# Patient Record
Sex: Male | Born: 1995 | Race: Black or African American | Hispanic: No | Marital: Single | Smoking: Never smoker
Health system: Southern US, Community
[De-identification: ages and names within clinical notes are randomized; demographics above are authoritative.]

## PROBLEM LIST (undated history)

## (undated) DIAGNOSIS — Z931 Gastrostomy status: Secondary | ICD-10-CM

## (undated) DIAGNOSIS — Z930 Tracheostomy status: Secondary | ICD-10-CM

## (undated) DIAGNOSIS — G808 Other cerebral palsy: Secondary | ICD-10-CM

## (undated) DIAGNOSIS — L89153 Pressure ulcer of sacral region, stage 3: Secondary | ICD-10-CM

## (undated) HISTORY — PX: TRACHEOSTOMY: SUR1362

## (undated) HISTORY — PX: GASTROJEJUNOSTOMY W/ JEJUNOSTOMY TUBE: SHX1698

---
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
## 2020-12-16 ENCOUNTER — Other Ambulatory Visit: Payer: Self-pay

## 2020-12-16 ENCOUNTER — Inpatient Hospital Stay (HOSPITAL_COMMUNITY)
Admission: EM | Admit: 2020-12-16 | Discharge: 2020-12-19 | DRG: 177 | Disposition: A | Discharge status: Home Health | Payer: Medicaid Other | Attending: Internal Medicine | Admitting: Internal Medicine

## 2020-12-16 ENCOUNTER — Emergency Department (HOSPITAL_COMMUNITY): Payer: Medicaid Other

## 2020-12-16 ENCOUNTER — Encounter (HOSPITAL_COMMUNITY): Payer: Self-pay | Admitting: Emergency Medicine

## 2020-12-16 DIAGNOSIS — J1282 Pneumonia due to coronavirus disease 2019: Secondary | ICD-10-CM | POA: Diagnosis present

## 2020-12-16 DIAGNOSIS — J9601 Acute respiratory failure with hypoxia: Secondary | ICD-10-CM

## 2020-12-16 DIAGNOSIS — L89892 Pressure ulcer of other site, stage 2: Secondary | ICD-10-CM | POA: Diagnosis present

## 2020-12-16 DIAGNOSIS — R7881 Bacteremia: Secondary | ICD-10-CM | POA: Diagnosis present

## 2020-12-16 DIAGNOSIS — G800 Spastic quadriplegic cerebral palsy: Secondary | ICD-10-CM | POA: Diagnosis present

## 2020-12-16 DIAGNOSIS — Z930 Tracheostomy status: Secondary | ICD-10-CM

## 2020-12-16 DIAGNOSIS — Z931 Gastrostomy status: Secondary | ICD-10-CM

## 2020-12-16 DIAGNOSIS — J9621 Acute and chronic respiratory failure with hypoxia: Secondary | ICD-10-CM | POA: Diagnosis present

## 2020-12-16 DIAGNOSIS — G808 Other cerebral palsy: Secondary | ICD-10-CM | POA: Diagnosis not present

## 2020-12-16 DIAGNOSIS — Z886 Allergy status to analgesic agent status: Secondary | ICD-10-CM

## 2020-12-16 DIAGNOSIS — U071 COVID-19: Secondary | ICD-10-CM | POA: Diagnosis present

## 2020-12-16 DIAGNOSIS — L89153 Pressure ulcer of sacral region, stage 3: Secondary | ICD-10-CM | POA: Diagnosis present

## 2020-12-16 DIAGNOSIS — J690 Pneumonitis due to inhalation of food and vomit: Secondary | ICD-10-CM | POA: Diagnosis present

## 2020-12-16 DIAGNOSIS — B957 Other staphylococcus as the cause of diseases classified elsewhere: Secondary | ICD-10-CM | POA: Diagnosis present

## 2020-12-16 HISTORY — DX: Other cerebral palsy: G80.8

## 2020-12-16 HISTORY — DX: Pressure ulcer of sacral region, stage 3: L89.153

## 2020-12-16 HISTORY — DX: Gastrostomy status: Z93.1

## 2020-12-16 HISTORY — DX: Tracheostomy status: Z93.0

## 2020-12-16 LAB — CBC WITH DIFFERENTIAL/PLATELET
Abs Immature Granulocytes: 0.01 10*3/uL (ref 0.00–0.07)
Basophils Absolute: 0 10*3/uL (ref 0.0–0.1)
Basophils Relative: 0 %
Eosinophils Absolute: 0.1 10*3/uL (ref 0.0–0.5)
Eosinophils Relative: 1 %
HCT: 52.7 % — ABNORMAL HIGH (ref 39.0–52.0)
Hemoglobin: 17 g/dL (ref 13.0–17.0)
Immature Granulocytes: 0 %
Lymphocytes Relative: 15 %
Lymphs Abs: 0.9 10*3/uL (ref 0.7–4.0)
MCH: 29.6 pg (ref 26.0–34.0)
MCHC: 32.3 g/dL (ref 30.0–36.0)
MCV: 91.8 fL (ref 80.0–100.0)
Monocytes Absolute: 0.8 10*3/uL (ref 0.1–1.0)
Monocytes Relative: 13 %
Neutro Abs: 4.1 10*3/uL (ref 1.7–7.7)
Neutrophils Relative %: 71 %
Platelets: 232 10*3/uL (ref 150–400)
RBC: 5.74 MIL/uL (ref 4.22–5.81)
RDW: 14.9 % (ref 11.5–15.5)
WBC: 5.8 10*3/uL (ref 4.0–10.5)
nRBC: 0 % (ref 0.0–0.2)

## 2020-12-16 LAB — FIBRINOGEN: Fibrinogen: 451 mg/dL (ref 210–475)

## 2020-12-16 LAB — PROCALCITONIN: Procalcitonin: <0.1 ng/mL

## 2020-12-16 LAB — LACTATE DEHYDROGENASE: LDH: 163 U/L (ref 98–192)

## 2020-12-16 LAB — C-REACTIVE PROTEIN: CRP: 20.9 mg/dL — ABNORMAL HIGH (ref <1.0)

## 2020-12-16 LAB — FERRITIN: Ferritin: 106 ng/mL (ref 24–336)

## 2020-12-16 LAB — HIV ANTIBODY (ROUTINE TESTING W REFLEX): HIV Screen 4th Generation wRfx: NONREACTIVE

## 2020-12-16 LAB — COMPREHENSIVE METABOLIC PANEL
ALT: 29 U/L (ref 0–44)
AST: 36 U/L (ref 15–41)
Albumin: 4.7 g/dL (ref 3.5–5.0)
Alkaline Phosphatase: 148 U/L — ABNORMAL HIGH (ref 38–126)
Anion gap: 18 — ABNORMAL HIGH (ref 5–15)
BUN: 6 mg/dL (ref 6–20)
CO2: 23 mmol/L (ref 22–32)
Calcium: 9.4 mg/dL (ref 8.9–10.3)
Chloride: 95 mmol/L — ABNORMAL LOW (ref 98–111)
Creatinine, Ser: 0.38 mg/dL — ABNORMAL LOW (ref 0.61–1.24)
GFR, Estimated: >60 mL/min (ref >60)
Glucose, Bld: 110 mg/dL — ABNORMAL HIGH (ref 70–99)
Potassium: 3.3 mmol/L — ABNORMAL LOW (ref 3.5–5.1)
Sodium: 136 mmol/L (ref 135–145)
Total Bilirubin: 0.5 mg/dL (ref 0.3–1.2)
Total Protein: 9.8 g/dL — ABNORMAL HIGH (ref 6.5–8.1)

## 2020-12-16 LAB — D-DIMER, QUANTITATIVE: D-Dimer, Quant: 1.52 ug/mL-FEU — ABNORMAL HIGH (ref 0.00–0.50)

## 2020-12-16 LAB — LACTIC ACID, PLASMA
Lactic Acid, Venous: 6.3 mmol/L (ref 0.5–1.9)
Lactic Acid, Venous: 1.3 mmol/L (ref 0.5–1.9)
Lactic Acid, Venous: 1.2 mmol/L (ref 0.5–1.9)

## 2020-12-16 LAB — PROTIME-INR
INR: 1.1 (ref 0.8–1.2)
Prothrombin Time: 14.5 seconds (ref 11.4–15.2)

## 2020-12-16 LAB — APTT: aPTT: 26 seconds (ref 24–36)

## 2020-12-16 MED ORDER — PROSOURCE TF PO LIQD
45.0000 mL | Freq: Every day | ORAL | Status: DC
  Administered 2020-12-17 – 2020-12-19 (×3): 45 mL
  Filled 2020-12-16 (×4): qty 45

## 2020-12-16 MED ORDER — SODIUM CHLORIDE 0.9% FLUSH: 3.0000 mL | INTRAVENOUS | Status: DC | PRN

## 2020-12-16 MED ORDER — BUDESONIDE 0.5 MG/2ML IN SUSP
0.5000 mg | Freq: Two times a day (BID) | RESPIRATORY_TRACT | Status: DC
  Administered 2020-12-18: 0.5 mg via RESPIRATORY_TRACT
  Filled 2020-12-16 (×4): qty 2

## 2020-12-16 MED ORDER — ONDANSETRON HCL 4 MG/2ML IJ SOLN
4.0000 mg | Freq: Four times a day (QID) | INTRAMUSCULAR | Status: DC | PRN
Start: 2020-12-16 — End: 2020-12-19

## 2020-12-16 MED ORDER — PANTOPRAZOLE SODIUM 40 MG PO PACK
40.0000 mg | PACK | Freq: Every day | ORAL | Status: DC
  Administered 2020-12-17 – 2020-12-19 (×3): 40 mg
  Filled 2020-12-16 (×3): qty 20

## 2020-12-16 MED ORDER — PANTOPRAZOLE SODIUM 40 MG PO PACK
40.0000 mg | PACK | Freq: Once | ORAL | Status: AC
  Administered 2020-12-16: 40 mg
  Filled 2020-12-16: qty 20

## 2020-12-16 MED ORDER — GUAIFENESIN-DM 100-10 MG/5ML PO SYRP: 10.0000 mL | ORAL_SOLUTION | ORAL | Status: DC | PRN

## 2020-12-16 MED ORDER — VITAL 1.5 CAL PO LIQD
1275.0000 mL | ORAL | Status: DC
  Administered 2020-12-16: 1275 mL
  Filled 2020-12-16 (×3): qty 2000

## 2020-12-16 MED ORDER — POLYETHYLENE GLYCOL 3350 17 GM/SCOOP PO POWD
17.0000 g | Freq: Every day | ORAL | Status: DC | PRN
  Filled 2020-12-16: qty 255

## 2020-12-16 MED ORDER — IBUPROFEN 100 MG/5ML PO SUSP
600.0000 mg | Freq: Once | ORAL | Status: AC
  Administered 2020-12-16: 600 mg
  Filled 2020-12-16: qty 30

## 2020-12-16 MED ORDER — OXYCODONE HCL 5 MG PO TABS
5.0000 mg | ORAL_TABLET | ORAL | Status: DC | PRN
Start: 2020-12-16 — End: 2020-12-19

## 2020-12-16 MED ORDER — ASCORBIC ACID 500 MG PO TABS
500.0000 mg | ORAL_TABLET | Freq: Every day | ORAL | Status: DC
  Administered 2020-12-17: 500 mg via ORAL
  Filled 2020-12-16: qty 1

## 2020-12-16 MED ORDER — CYPROHEPTADINE HCL 4 MG PO TABS
2.0000 mg | ORAL_TABLET | Freq: Two times a day (BID) | ORAL | Status: DC
  Administered 2020-12-16 – 2020-12-19 (×6): 2 mg via JEJUNOSTOMY
  Filled 2020-12-16 (×7): qty 1

## 2020-12-16 MED ORDER — FLEET ENEMA 7-19 GM/118ML RE ENEM: 1.0000 | ENEMA | Freq: Once | RECTAL | Status: DC | PRN

## 2020-12-16 MED ORDER — ALBUTEROL SULFATE 0.63 MG/3ML IN NEBU: 1.0000 | INHALATION_SOLUTION | Freq: Four times a day (QID) | RESPIRATORY_TRACT | Status: DC | PRN

## 2020-12-16 MED ORDER — ALBUTEROL SULFATE HFA 108 (90 BASE) MCG/ACT IN AERS
2.0000 | INHALATION_SPRAY | RESPIRATORY_TRACT | Status: DC | PRN
  Filled 2020-12-16: qty 6.7

## 2020-12-16 MED ORDER — CETIRIZINE HCL 5 MG/5ML PO SOLN
10.0000 mg | Freq: Every day | ORAL | Status: DC
  Administered 2020-12-17 – 2020-12-19 (×3): 10 mg via JEJUNOSTOMY
  Filled 2020-12-16 (×3): qty 10

## 2020-12-16 MED ORDER — SODIUM CHLORIDE 0.9 % IV SOLN
100.0000 mg | Freq: Every day | INTRAVENOUS | Status: AC
  Administered 2020-12-16 – 2020-12-19 (×4): 100 mg via INTRAVENOUS
  Filled 2020-12-16 (×2): qty 20
  Filled 2020-12-16: qty 100
  Filled 2020-12-16: qty 20
  Filled 2020-12-16: qty 100

## 2020-12-16 MED ORDER — BISACODYL 5 MG PO TBEC: 5.0000 mg | DELAYED_RELEASE_TABLET | Freq: Every day | ORAL | Status: DC | PRN

## 2020-12-16 MED ORDER — DOXYCYCLINE CALCIUM 50 MG/5ML PO SYRP
50.0000 mg | ORAL_SOLUTION | Freq: Every day | ORAL | Status: DC
  Filled 2020-12-16: qty 5

## 2020-12-16 MED ORDER — ONDANSETRON HCL 4 MG PO TABS: 4.0000 mg | ORAL_TABLET | Freq: Four times a day (QID) | ORAL | Status: DC | PRN

## 2020-12-16 MED ORDER — SODIUM CHLORIDE 0.9% FLUSH
3.0000 mL | Freq: Two times a day (BID) | INTRAVENOUS | Status: DC
  Administered 2020-12-16 – 2020-12-19 (×6): 3 mL via INTRAVENOUS

## 2020-12-16 MED ORDER — METHYLPREDNISOLONE SODIUM SUCC 40 MG IJ SOLR
40.0000 mg | Freq: Two times a day (BID) | INTRAMUSCULAR | Status: DC
  Administered 2020-12-16: 40 mg via INTRAVENOUS
  Filled 2020-12-16: qty 1

## 2020-12-16 MED ORDER — LANSOPRAZOLE 30 MG PO TBDD: 30.0000 mg | DELAYED_RELEASE_TABLET | Freq: Every day | ORAL | Status: DC

## 2020-12-16 MED ORDER — SODIUM CHLORIDE 0.9 % IV SOLN: 250.0000 mL | INTRAVENOUS | Status: DC | PRN

## 2020-12-16 MED ORDER — ZINC SULFATE 220 (50 ZN) MG PO CAPS: 220.0000 mg | ORAL_CAPSULE | Freq: Every day | ORAL | Status: DC

## 2020-12-16 MED ORDER — POTASSIUM CHLORIDE 20 MEQ PO PACK
40.0000 meq | PACK | Freq: Once | ORAL | Status: AC
  Administered 2020-12-17: 40 meq
  Filled 2020-12-16: qty 2

## 2020-12-16 MED ORDER — PREDNISONE 20 MG PO TABS: 50.0000 mg | ORAL_TABLET | Freq: Every day | ORAL | Status: DC

## 2020-12-16 MED ORDER — SODIUM CHLORIDE 0.9% FLUSH
3.0000 mL | Freq: Two times a day (BID) | INTRAVENOUS | Status: DC
  Administered 2020-12-17 – 2020-12-19 (×4): 3 mL via INTRAVENOUS

## 2020-12-16 MED ORDER — BETHANECHOL 1 MG/ML PEDIATRIC ORAL SUSPENSION: 3.7500 mg | Freq: Two times a day (BID) | ORAL | Status: DC

## 2020-12-16 MED ORDER — FREE WATER
150.0000 mL | Status: DC
  Administered 2020-12-16 – 2020-12-19 (×17): 150 mL

## 2020-12-16 MED ORDER — FLUTICASONE PROPIONATE 50 MCG/ACT NA SUSP
1.0000 | Freq: Every day | NASAL | Status: DC
  Administered 2020-12-17 – 2020-12-19 (×3): 1 via NASAL
  Filled 2020-12-16: qty 16

## 2020-12-16 MED ORDER — LEVETIRACETAM 100 MG/ML PO SOLN
1000.0000 mg | Freq: Two times a day (BID) | ORAL | Status: DC
  Administered 2020-12-16 – 2020-12-19 (×6): 1000 mg via JEJUNOSTOMY
  Filled 2020-12-16 (×8): qty 10

## 2020-12-16 MED ORDER — SODIUM CHLORIDE 0.9 % IV SOLN
1000.0000 mL | INTRAVENOUS | Status: DC
  Administered 2020-12-16: 1000 mL via INTRAVENOUS

## 2020-12-16 MED ORDER — DOCUSATE SODIUM 50 MG/5ML PO LIQD
100.0000 mg | Freq: Two times a day (BID) | ORAL | Status: DC
  Administered 2020-12-16 – 2020-12-17 (×3): 100 mg
  Filled 2020-12-16 (×3): qty 10

## 2020-12-16 MED ORDER — PHENOBARBITAL 20 MG/5ML PO ELIX
40.5000 mg | ORAL_SOLUTION | Freq: Every day | ORAL | Status: DC
  Administered 2020-12-16 – 2020-12-18 (×3): 40.5 mg via JEJUNOSTOMY
  Filled 2020-12-16 (×3): qty 15

## 2020-12-16 MED ORDER — ZINC SULFATE 220 (50 ZN) MG PO CAPS
220.0000 mg | ORAL_CAPSULE | Freq: Every day | ORAL | Status: DC
  Administered 2020-12-17 – 2020-12-19 (×3): 220 mg
  Filled 2020-12-16 (×3): qty 1

## 2020-12-16 MED ORDER — METHYLPREDNISOLONE SODIUM SUCC 125 MG IJ SOLR
125.0000 mg | Freq: Once | INTRAMUSCULAR | Status: AC
  Administered 2020-12-16: 125 mg via INTRAVENOUS
  Filled 2020-12-16: qty 2

## 2020-12-16 MED ORDER — DOXYCYCLINE MONOHYDRATE 25 MG/5ML PO SUSR: 50.0000 mg | Freq: Every day | ORAL | Status: DC

## 2020-12-16 MED ORDER — BETHANECHOL CHLORIDE 5 MG PO TABS
5.0000 mg | ORAL_TABLET | Freq: Two times a day (BID) | ORAL | Status: DC
  Administered 2020-12-16 – 2020-12-19 (×6): 5 mg
  Filled 2020-12-16 (×7): qty 1

## 2020-12-16 MED ORDER — ALBUTEROL SULFATE (2.5 MG/3ML) 0.083% IN NEBU: 2.5000 mg | INHALATION_SOLUTION | Freq: Four times a day (QID) | RESPIRATORY_TRACT | Status: DC | PRN

## 2020-12-16 MED ORDER — ACETAMINOPHEN 325 MG PO TABS
650.0000 mg | ORAL_TABLET | Freq: Four times a day (QID) | ORAL | Status: DC | PRN
  Administered 2020-12-18: 650 mg via ORAL
  Filled 2020-12-16: qty 2

## 2020-12-16 MED ORDER — HYDROCOD POLST-CPM POLST ER 10-8 MG/5ML PO SUER: 5.0000 mL | Freq: Two times a day (BID) | ORAL | Status: DC | PRN

## 2020-12-16 MED ORDER — CYPROHEPTADINE HCL 2 MG/5ML PO SYRP
2.0000 mg | ORAL_SOLUTION | Freq: Two times a day (BID) | ORAL | Status: DC
  Filled 2020-12-16: qty 5

## 2020-12-16 MED ORDER — ENOXAPARIN SODIUM 40 MG/0.4ML IJ SOSY
40.0000 mg | PREFILLED_SYRINGE | INTRAMUSCULAR | Status: DC
  Administered 2020-12-17 – 2020-12-19 (×3): 40 mg via SUBCUTANEOUS
  Filled 2020-12-16 (×3): qty 0.4

## 2020-12-16 MED ORDER — POTASSIUM CHLORIDE CRYS ER 20 MEQ PO TBCR: 40.0000 meq | EXTENDED_RELEASE_TABLET | Freq: Once | ORAL | Status: DC

## 2020-12-16 MED ORDER — DOCUSATE SODIUM 100 MG PO CAPS: 100.0000 mg | ORAL_CAPSULE | Freq: Two times a day (BID) | ORAL | Status: DC

## 2020-12-16 MED ORDER — POLYETHYLENE GLYCOL 3350 17 G PO PACK: 17.0000 g | PACK | Freq: Every day | ORAL | Status: DC | PRN

## 2020-12-16 MED ORDER — DIAZEPAM 1 MG/ML PO SOLN
5.0000 mg | Freq: Two times a day (BID) | ORAL | Status: DC | PRN
  Administered 2020-12-18 (×2): 5 mg via JEJUNOSTOMY
  Filled 2020-12-16 (×2): qty 5

## 2020-12-16 MED ORDER — METHYLPREDNISOLONE SODIUM SUCC 500 MG IJ SOLR
0.5000 mg/kg | Freq: Two times a day (BID) | INTRAMUSCULAR | Status: DC
Start: 2020-12-16 — End: 2020-12-16

## 2020-12-16 MED ORDER — SODIUM CHLORIDE 0.9 % IV BOLUS (SEPSIS)
1000.0000 mL | Freq: Once | INTRAVENOUS | Status: AC
  Administered 2020-12-16: 1000 mL via INTRAVENOUS

## 2020-12-16 MED ORDER — SODIUM CHLORIDE 0.9 % IV SOLN: 100.0000 mg | Freq: Every day | INTRAVENOUS | Status: DC

## 2020-12-16 MED ORDER — SODIUM CHLORIDE 0.9 % IV BOLUS
500.0000 mL | Freq: Once | INTRAVENOUS | Status: AC
  Administered 2020-12-16: 500 mL via INTRAVENOUS

## 2020-12-16 NOTE — Progress Notes (Addendum)
Initial Nutrition Assessment  DOCUMENTATION CODES:  Underweight  INTERVENTION:  Continue nocturnal TF via GJ tube: -Vital 1.5 @ 10ml/hr x15 hours (1216ml/d) tp be administered from 1800-0900 -Prosource TF 50ml daily - free water Q4H  Provides 1952 kcals, 97g protein, free water ( total free water with flushes)  Also provide the following via tube: -1 packet Juven BID, each packet provides 95 calories, 2.5 grams of protein (collagen), and 9.8 grams of carbohydrate (3 grams sugar); also contains 7 grams of L-arginine and L-glutamine, 300 mg vitamin C, 15 mg vitamin E, 1.2 mcg vitamin B-12, 9.5 mg zinc, 200 mg calcium, and 1.5 g  Calcium Beta-hydroxy-Beta-methylbutyrate to support wound healing  NUTRITION DIAGNOSIS:  Underweight related to chronic illness (cerebral palsy) as evidenced by other (comment) (BMI 17.3).  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  At home-patient developed worsening shortness of breath requiring more oxygen-as a result patient was brought to the hospital for further evaluation and treatment. Now admitted with   Acute Hypoxic Resp Failure due to Covid 19 Viral pneumonia and aspiration pneumonia.  RD received page to on-call pager from Pharmacy regarding pt's tube feeding. Per documentation and discussions with Pharmacy, at home, pt uses 6 cans of Perative overnight at 21ml/hr (provides 1848 kcals,  95g protein, free water. Perative not on hospital formulary, so provided pharmacist with alternative TF regimen that provides similar kcals/protein. Note recommended regimen provides slightly more calories; however, pt's nutrient needs are increased 2/2 COVID-19 infection, so this increase is not a concern. Pt was initiated on Vital 1.5 @ 27ml/hr x15 hours, to be administered from 1800-0900, with Prosource TF 41ml daily and free water Q4H. This provides 1952 kcals, 97g protein, total free water. RD now consulted to initiate/manage TF now that pt has been admitted to a room. Attempted to visit pt but pt unavailable x2 attempts. No weight history available for review. Pt is underweight based on BMI. Pt likely meets criteria for malnutrition; however, unable to diagnose at this time without nutrition-focused physical exam. Will attempt at follow-up.  Pt with stage 2 pressure injury to buttocks.   No UOP documented x24 hours I/O: +1229ml since admit  Medications:   [START ON 12/18/2020] vitamin C  500 mg Per Tube Daily   bethanechol  5 mg Per Tube BID   budesonide  0.5 mg Inhalation BID   cetirizine HCl  10 mg Per J Tube Daily   cyproheptadine  2 mg Per J Tube BID   docusate  100 mg Per Tube BID   doxycycline  50 mg Per Tube Daily   enoxaparin (LOVENOX) injection  40 mg Subcutaneous Q24H   feeding supplement (PROSource TF)  45 mL Per Tube Daily   feeding supplement (VITAL 1.5 CAL)  1,275 mL Per Tube Q24H   fluticasone  1-2 spray Each Nare Daily   free water  150 mL Per Tube Q4H   levETIRAcetam  1,000 mg Per J Tube BID   methylPREDNISolone (SOLU-MEDROL) injection  20 mg Intravenous Q12H   Followed by   Melene Muller ON 12/20/2020] predniSONE  50 mg Per Tube Daily   pantoprazole sodium  40 mg Per Tube Daily   PHENObarbital  40.5 mg Per J Tube QHS   potassium chloride  40 mEq Per Tube Once   sodium chloride flush  3 mL Intravenous Q12H   sodium chloride flush  3 mL Intravenous Q12H    zinc sulfate  220 mg Per Tube Daily   Labs: Recent Labs  Lab 12/16/20 0510 12/17/20 0626  NA 136 137  K 3.3* 4.3  CL 95* 100  CO2 23 29  BUN 6 7  CREATININE 0.38* <0.30*  CALCIUM 9.4 8.7*  MG  --  2.0  PHOS  --  3.7  GLUCOSE 110* 120*   Diet Order:   Diet Order     None       EDUCATION NEEDS:  No education needs have been identified at this time  Skin:  Skin Assessment: Skin Integrity Issues: Skin Integrity Issues:: Stage II Stage II: buttocks  Last BM:  PTA  Height:  Ht Readings from Last 1 Encounters:  12/17/20 5\' 3"  (1.6 m)   Weight:  Wt Readings from Last 1 Encounters:  12/17/20 44.3 kg   BMI:  Body mass index is 17.3 kg/m.  Estimated Nutritional Needs:  Kcal:  1800-2000 Protein:  90-100g Fluid:  >1.8L/d    02/16/21, MS, RD, LDN RD pager number and weekend/on-call pager number located in Amion.

## 2020-12-16 NOTE — ED Triage Notes (Signed)
Patient arrived with EMS from home reports fever with chest congestion/SOB  , HR=160+ onset this evening , seen at [facility] yesterday diagnosed with Covid 19 , Hx: tracheostomy /cerebral palsy .

## 2020-12-16 NOTE — ED Notes (Signed)
RN attempted report x1.  

## 2020-12-16 NOTE — ED Notes (Signed)
IV team consult ordered after multiple unsuccessful venipuncture by RN . EDP notified.

## 2020-12-16 NOTE — Progress Notes (Signed)
Report received that [facility] called to tell patient's mother than blood cultures are positive - no further information is currently available including in Epic.  Blood cultures here are already pending so will await C&S for now unless patient shows further evidence of infection.  Procalcitonin is still pending.   Lactate has cleared, he is currently afebrile, and he is stable on 5L trach collar O2.   Georgana Curio, M.D.

## 2020-12-16 NOTE — ED Provider Notes (Signed)
[facility] EMERGENCY DEPARTMENT Provider Note  CSN: [id number] Arrival date & time: 12/16/20 0428  Chief Complaint(s) Covid+ / SOB - Chest congestion/Fever  HPI Bryan [name] is a 25 y.o. male with a history of CP, trach, G-tube dependence who was diagnosed with COVID-19 yesterday at an outside hospital presents for increased work of breathing, increased secretions, and high fever with temp of 103.2 at home.  At baseline patient is on 2 L trach mask at home.  Has had increased oxygen requirement.  Mother reports that her condenser only goes up to 3 L and the patient was satting high 80s and low 90s.  Patient was scheduled to go to facility in [facility] for IV infusion of antiviral medication to treat his COVID.  She denies any emesis or diarrhea.  She does report that the patient's abdomen is a bit more distended than usual.   Remainder of history, ROS, and physical exam limited due to patient's condition (nonverbal). Additional information was obtained from mother.   Level V Caveat.    HPI  Past Medical History History reviewed. No pertinent past medical history. There are no problems to display for this patient.  Home Medication(s) Prior to Admission medications   Not on File                                                                                                                                    Past Surgical History History reviewed. No pertinent surgical history. Family History No family history on file.  Social History Social History   Tobacco Use  . Smoking status: Never Smoker  . Smokeless tobacco: Never Used  Substance Use Topics  . Alcohol use: Never  . Drug use: Never   Allergies Patient has no known allergies.  Review of Systems Review of Systems Unable to obtain due to patient being nonverbal Physical Exam Vital Signs  I have reviewed the triage vital signs BP (!) 157/98 (BP Location: Right Arm)   Pulse (!) 160    Temp (!) 102.8 F (39.3 C) (Oral)   Resp (!) 28   SpO2 94%   Physical Exam Vitals reviewed.  Constitutional:      General: He is not in acute distress.    Appearance: He is well-developed. He is not diaphoretic.  HENT:     Head: Normocephalic and atraumatic.     Nose: Nose normal.  Eyes:     General: No scleral icterus.       Right eye: No discharge.        Left eye: No discharge.     Conjunctiva/sclera: Conjunctivae normal.     Pupils: Pupils are equal, round, and reactive to light.  Neck:   Cardiovascular:     Rate and Rhythm: Regular rhythm. Tachycardia present.     Heart sounds: No murmur heard. No friction rub. No gallop.   Pulmonary:     Effort:  Tachypnea and accessory muscle usage present. No respiratory distress.     Breath sounds: No stridor. Examination of the right-lower field reveals rhonchi. Examination of the left-lower field reveals rhonchi. Rhonchi present.  Abdominal:     General: There is no distension.     Palpations: Abdomen is soft.     Tenderness: There is no abdominal tenderness.    Musculoskeletal:        General: No tenderness.     Cervical back: Normal range of motion and neck supple.  Skin:    General: Skin is warm and dry.     Findings: No erythema or rash.  Neurological:     Mental Status: He is alert. Mental status is at baseline.     Comments: Baseline extremity contractures     ED Results and Treatments Labs (all labs ordered are listed, but only abnormal results are displayed) Labs Reviewed  LACTIC ACID, PLASMA - Abnormal; Notable for the following components:      Result Value   Lactic Acid, Venous 6.3 (*)    All other components within normal limits  COMPREHENSIVE METABOLIC PANEL - Abnormal; Notable for the following components:   Potassium 3.3 (*)    Chloride 95 (*)    Glucose, Bld 110 (*)    Creatinine, Ser 0.38 (*)    Total Protein 9.8 (*)    Alkaline Phosphatase 148 (*)    Anion gap 18 (*)    All other components  within normal limits  CBC WITH DIFFERENTIAL/PLATELET - Abnormal; Notable for the following components:   HCT 52.7 (*)    All other components within normal limits  CULTURE, BLOOD (SINGLE)  URINE CULTURE  PROTIME-INR  APTT  LACTIC ACID, PLASMA  URINALYSIS, ROUTINE W REFLEX MICROSCOPIC                                                                                                                         EKG  EKG Interpretation  Date/Time:  Wednesday December 16 2020 06:07:11 EDT Ventricular Rate:  146 PR Interval:  132 QRS Duration: 81 QT Interval:  279 QTC Calculation: 435 R Axis:   75 Text Interpretation: Sinus tachycardia Ventricular premature complex LVH by voltage Borderline T abnormalities, anterior leads No old tracing to compare Confirmed by Drema Pryardama, Magnum Lunde [phone]([id number]) on 12/16/2020 6:18:20 AM      Radiology DG Chest Port 1 View  Result Date: 12/16/2020 CLINICAL DATA:  Questionable sepsis. EXAM: PORTABLE CHEST 1 VIEW COMPARISON:  11/15/2009. FINDINGS: Tracheostomy tube noted with tip over the trachea. Heart size normal. Bibasilar atelectasis. Left base infiltrate suggesting pneumonia noted. No pleural effusion or pneumothorax. Thoracic spine scoliosis with thoracolumbar spine fusion. Hardware intact. IMPRESSION: 1.  Tracheostomy tube in good anatomic position. 2. Bibasilar atelectasis. Left base infiltrate consistent pneumonia. Electronically Signed   By: Maisie Fushomas  Register   On: 12/16/2020 05:26    Pertinent labs & imaging results that were available during my care of the patient were reviewed by me  and considered in my medical decision making (see chart for details).  Medications Ordered in ED Medications  sodium chloride 0.9 % bolus 1,000 mL (1,000 mLs Intravenous New Bag/Given 12/16/20 0624)    Followed by  0.9 %  sodium chloride infusion (1,000 mLs Intravenous New Bag/Given 12/16/20 0625)  sodium chloride 0.9 % bolus 500 mL (has no administration in time range)  methylPREDNISolone  sodium succinate (SOLU-MEDROL) 125 mg/2 mL injection 125 mg (has no administration in time range)  ibuprofen (ADVIL) 100 MG/5ML suspension 600 mg (600 mg Per Tube Given 12/16/20 0557)  pantoprazole sodium (PROTONIX) 40 mg/20 mL oral suspension 40 mg (40 mg Per Tube Given 12/16/20 0557)                                                                                                                                    Procedures .1-3 Lead EKG Interpretation Performed by: Nira Conn, MD Authorized by: Nira Conn, MD     Interpretation: normal     ECG rate:  160   ECG rate assessment: tachycardic     Rhythm: sinus tachycardia     Ectopy: none     Conduction: normal    .Critical Care Performed by: Nira Conn, MD Authorized by: Nira Conn, MD   Critical care provider statement:    Critical care time (minutes):  60   Critical care was necessary to treat or prevent imminent or life-threatening deterioration of the following conditions:  Respiratory failure   Critical care was time spent personally by me on the following activities:  Discussions with consultants, evaluation of patient's response to treatment, examination of patient, ordering and performing treatments and interventions, ordering and review of laboratory studies, ordering and review of radiographic studies, pulse oximetry, re-evaluation of patient's condition, obtaining history from patient or surrogate and review of old charts   Care discussed with: admitting provider      (including critical care time)  Medical Decision Making / ED Course I have reviewed the nursing notes for this encounter and the patient's prior records (if available in EHR or on provided paperwork).   Bryan [name] was evaluated in Emergency Department on 12/16/2020 for the symptoms described in the history of present illness. He was evaluated in the context of the global COVID-19 pandemic, which  necessitated consideration that the patient might be at risk for infection with the SARS-CoV-2 virus that causes COVID-19. Institutional protocols and algorithms that pertain to the evaluation of patients at risk for COVID-19 are in a state of rapid change based on information released by regulatory bodies including the CDC and federal and state organizations. These policies and algorithms were followed during the patient's care in the ED.  Respiratory distress in the setting of recently Dx'd COVID. Increased O2 demand. He is febrile and tachycardic. Chest x-ray notable for bibasilar atelectasis and left lower lobe opacity concerning for pneumonia.  Patient  does not have leukocytosis on CBC.  X-ray findings likely related to COVID/viral pneumonia. Will hold antibiotics for now.  No significant electrolyte derangements or renal sufficiency.  Patient's lactic acid is 6.3.  Patient is septic but from viral process. Patient provided with IV fluids, and Motrin.   Patient is maintaining good sats with a trach mask at 5 L. Heart rate significantly improved down to the 120s after 500 cc of IV fluids.  Total of 1.5 L of IV fluids were ordered for the patient which is > 30 cc/kg of IV fluid.  Patient was given steroids and remdesivir. We will call to have him admitted to the stepdown unit for further work-up and management         Final Clinical Impression(s) / ED Diagnoses Final diagnoses:  Acute hypoxemic respiratory failure due to COVID-19 [facility])      This chart was dictated using voice recognition software.  Despite best efforts to proofread,  errors can occur which can change the documentation meaning.   Nira Conn, MD 12/16/20 [phone]

## 2020-12-16 NOTE — Consult Note (Signed)
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  Moisture associated skin damage (MASD)  to buttocks and posterior scrotum Wound type:pressure and moisture Pressure Injury POA: Yes  seen at wound care center at [facility].  Currently using collagen to sacral wound, which is nonformualry.  I will implement silver hydrofiber.  Measurement:0.3 cm opening with 4 cm scarring from healed tissue, extends 6 cm circumferentially.  Intact skin with no return of melanin Denuded skin to buttocks and posterior scrotum from MASD Wound LTJ:QZES and moist Drainage (amount, consistency, odor) minimal serosanguinous no odor Periwound:scarring to open wounds. Frequently moist  Condom cath currently in place Dressing procedure/placement/frequency: Cleanse sacral wound with NS and pat dry. Gently pack wound with thin strip of Aquacel Ag, leaving a tail protruding.  Cover with silicone foam.  Change daily.  Gerhardts butt paste to scrotum and buttocks breakdown twice daily and PRN soilage.  Will implement mattress with low air loss feature.  Prevalon boots bilaterally.  Will not follow at this time.  Please re-consult if needed.  Maple Hudson MSN, RN, FNP-BC CWON Wound, Ostomy, Continence Nurse Pager [phone]

## 2020-12-16 NOTE — ED Notes (Signed)
RN attempted report x2 

## 2020-12-16 NOTE — ED Notes (Signed)
Phlebotomy came to look for blood draw. No locations found for an attempt. RN notified.

## 2020-12-16 NOTE — Progress Notes (Addendum)
Pharmacy Consult - Remdesivir  25 yof presenting COVID-19 positive with respiratory symptoms requiring hospitalization. Pharmacy consulted to dose Remdesivir. ALT<220.  Confirmed COVID + yesterday at [facility] (in [facility]) - did receive Remdesivir 200 mg on 6/7@0652 .   Plan: Remdesivir 100mg  IV q24h to complete 4 total doses  , PharmD, BCCCP Clinical Pharmacist  Phone: [phone] 12/16/2020 7:29 AM  Please check AMION for all [facility] Pharmacy phone numbers After 10:00 PM, call Main Pharmacy [phone]

## 2020-12-16 NOTE — H&P (Signed)
History and Physical    Bryan [name] RKY:[id number] DOB: Aug 04, 1995 DOA: 12/16/2020  PCP: [facility], [place] Consultants:  Kennon Portela - orthopedics; Jamse Belfast - neurology; Glock - GI Patient coming from:  Home - lives with mother, step-father, siblings; NOK:  Mother, [name], [phone]   Chief Complaint: Worsening COVID symptoms  HPI: Bryan [name] is a 25 y.o. 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  Mother reports that he developed symptoms 2 days ago with congestion, SOB, fever.  He is on trach collar at home on 2L and her concentrated only allows up to 3L O2.  She took him to [facility] yesterday and he was diagnosed with COVID.  He was given one dose of remdesivir and antibiotics and discharged with plan for outpatient remdesivir.  His mother has full-time nursing support and felt comfortable with this plan, but he worsened overnight and because more hypoxic and so she called 911 and he was brought to [facility].  Family is not sick and are all vaccinated.  He does not leave home routinely.  Infection is thought to be from nursing staff.    ED Course:  CP with trach/PEG.  Here with COVID.  Went to [facility], treated and got better so went home for outpatient management.  Usually on 2L, worsened with increased secretions, fever, tachycardia.  Started steroids/Remedesivir.  HR improved.  Lactate 6.3.    Review of Systems: Unable to perform  Ambulatory Status:  None-ambulatory  COVID Vaccine Status:   Complete  Past Medical History:  Diagnosis Date  . Cerebral palsy, quadriplegic (HCC)   . Gastrointestinal tube present (HCC)    GJ tube  . Pressure ulcer of coccygeal region, stage III (HCC)   . Tracheostomy present [facility])     Past Surgical History:  Procedure Laterality Date  . GASTROJEJUNOSTOMY W/ JEJUNOSTOMY TUBE    .  TRACHEOSTOMY      Social History   Socioeconomic History  . Marital status: Single    Spouse name: Not on file  . Number of children: Not on file  . Years of education: Not on file  . Highest education level: Not on file  Occupational History  . Not on file  Tobacco Use  . Smoking status: Never Smoker  . Smokeless tobacco: Never Used  Substance and Sexual Activity  . Alcohol use: Never  . Drug use: Never  . Sexual activity: Not on file  Other Topics Concern  . Not on file  Social History Narrative  . Not on file   Social Determinants of Health   Financial Resource Strain: Not on file  Food Insecurity: Not on file  Transportation Needs: Not on file  Physical Activity: Not on file  Stress: Not on file  Social Connections: Not on file  Intimate Partner Violence: Not on file    Allergies  Allergen Reactions  . Naproxen Other (See Comments)    Cannot have due to illness  . Other Rash    Peach colored rubber tape: Caused rash (childhood allergy)     History reviewed. No pertinent family history.  Prior to Admission medications   Not on File    Physical Exam: Vitals:   12/16/20 1000 12/16/20 1330 12/16/20 1457 12/16/20 1635  BP: 111/73 110/77 115/80   Pulse: (!) 101 (!) 105 (!) 111 (!) 106  Resp: 17 20 18 20   Temp:  99.2 F (37.3 C)  TempSrc:  Oral    SpO2: 100% 96% 98% 96%     . General:  Appears chronically ill and with severe physical and cognitive impairment; able to smile but otherwise no apparent attempt at interaction . Eyes:   EOMI, normal lids, iris . ENT:  grossly normal lips & tongue, mmm . Neck:  Trach in place, currently on 5L trach collar O2 . Cardiovascular:  RR with tachycardia, no m/r/g. No LE edema.  Marland Kitchen Respiratory:   Diffuse rhonchi.  Normal respiratory effort. . Abdomen:  soft, NT, ND, GJ tube in place . Skin:  Stage 3 sacral pressure ulcer; stage 2 scrotal pressure wound . Musculoskeletal:  Contractures noted of B  UE/LE . Psychiatric:  Nonverbal, smiles but otherwise no apparently meaningful interaction . Neurologic:  Unable to perform    Radiological Exams on Admission: Independently reviewed - see discussion in A/P where applicable  DG Chest Port 1 View  Result Date: 12/16/2020 CLINICAL DATA:  Questionable sepsis. EXAM: PORTABLE CHEST 1 VIEW COMPARISON:  11/15/2009. FINDINGS: Tracheostomy tube noted with tip over the trachea. Heart size normal. Bibasilar atelectasis. Left base infiltrate suggesting pneumonia noted. No pleural effusion or pneumothorax. Thoracic spine scoliosis with thoracolumbar spine fusion. Hardware intact. IMPRESSION: 1.  Tracheostomy tube in good anatomic position. 2. Bibasilar atelectasis. Left base infiltrate consistent pneumonia. Electronically Signed   By: Maisie Fus  Register   On: 12/16/2020 05:26    EKG: Independently reviewed.  Sinus tachycardia with rate 146; nonspecific ST changes with no evidence of acute ischemia   Labs on Admission: I have personally reviewed the available labs and imaging studies at the time of the admission.  Pertinent labs:   K+ 3.3 Glucose 110 Anion gap 18 Lactate 6.3, 1.3 WBC 5.8 INR 1.1 CRP 20.9 LDH 163 Ferritin 106   Assessment/Plan Principal Problem:   COVID-19 Active Problems:   Cerebral palsy, quadriplegic (HCC)   Tracheostomy present (HCC)   Gastrointestinal tube present (HCC)   Pressure ulcer of coccygeal region, stage III (HCC)   Acute on chronic respiratory failure with hypoxia due to COVID-19 PNA -Patient with h/o trach on 2L trach collar O2 presenting with SOB and reported fever at home in the setting of previously-diagnosed COVID-19 infection -He usually uses 2L home O2 and is currently requiring 5L Lorenzo O2 -COVID POSITIVE on 6/7 -Pertinent labs concerning for COVID include normal WBC count; markedly elevated CRP (>7) -Initially markedly elevated lactate but this has normalized -CXR with LLL opacity which may be c/w  COVID vs. Multifocal PNA -Will not treat with broad-spectrum antibiotics if procalcitonin <0.5 -Will admit to progressive care for further evaluation, close monitoring, and treatment -Monitor on telemetry x at least 24 hours -At this time, will attempt to avoid use of aerosolized medications and use HFAs instead -Will check daily labs including BMP with Mag, Phos; LFTs; CBC with differential; CRP; ferritin; fibrinogen; D-dimer -Will order steroids and Remdesivir (pharmacy consult) given +COVID test, +CXR, and hypoxia <94% on room air -If the patient shows clinical deterioration, consider transfer to ICU with PCCM consultation -Consider IL-6 agonist (Actemra) and/or JAK inhibitor (baricitinib) if the patient does not stabilize on current treatment or if the patient has marked clinical decompensation; the patient does not appear to require this treatment at this time but has been consented and agrees to receive treatment if there is evidence of clinical worsening despite current treatments. -Will attempt to maintain euvolemia to a net negative fluid status -Patient was seen wearing N95, and face shield -Chest  physiotherapy requested -Continue Pulmicort, Zyrtec, Flonasc  CP -Patient with significant cognitive and physical effects -Has trach in place, on trach collar O2 for now -G-tube in place, continue tube feeds (pharmacy assisted with replacement since peractiv is not available here and families are not allowed to bring in tube feeds per policy) -Continue bethanechol, cyprohepatidine, Valium, Prevacid, Keppra, Phenobarbital  Pressure ulcers -Improving stage 3 sacral pressure ulcer, previously stage 4 -Stage 2 scrotal ulcer -Wound care consultation requested -Doxy is continued - ?related to pressure ulcers      Level of care: Telemetry Medical DVT prophylaxis:  Lovenox  Code Status:  Full - confirmed with family Family Communication: Mother was present throughout  evaluation Disposition Plan:  The patient is from: home  Anticipated d/c is to: home with [facility] services   Anticipated d/c date will depend on clinical response to treatment, likely between 3 days (with completion of outpatient Remdesivir treatment) and 5 days  Patient is currently: acutely ill Consults called: RT/Nutrition Admission status: Admit - It is my clinical opinion that admission to INPATIENT is reasonable and necessary because of the expectation that this patient will require hospital care that crosses at least 2 midnights to treat this condition based on the medical complexity of the problems presented.  Given the aforementioned information, the predictability of an adverse outcome is felt to be significant.    Jonah Blue MD [facility]   How to contact the [facility] Attending or Consulting provider 7A - 7P or covering provider during after hours 7P -7A, for this patient?  1. Check the care team in [facility] and look for a) attending/consulting TRH provider listed and b) the [facility] team listed 2. Log into [email or web address] and use [facility]'s universal password to access. If you do not have the password, please contact the hospital operator. 3. Locate the [facility] provider you are looking for under [facility] and page to a number that you can be directly reached. 4. If you still have difficulty reaching the provider, please page the [facility] (Director on Call) for the Hospitalists listed on amion for assistance.   12/16/2020, 5:33 PM

## 2020-12-16 NOTE — ED Notes (Signed)
EDP notified on elevated Lactic Acid result.  

## 2020-12-17 DIAGNOSIS — L89153 Pressure ulcer of sacral region, stage 3: Secondary | ICD-10-CM

## 2020-12-17 DIAGNOSIS — G808 Other cerebral palsy: Secondary | ICD-10-CM

## 2020-12-17 DIAGNOSIS — J9601 Acute respiratory failure with hypoxia: Secondary | ICD-10-CM

## 2020-12-17 LAB — BLOOD CULTURE ID PANEL (REFLEXED) - BCID2

## 2020-12-17 LAB — COMPREHENSIVE METABOLIC PANEL
ALT: 19 U/L (ref 0–44)
AST: 20 U/L (ref 15–41)
Albumin: 3.1 g/dL — ABNORMAL LOW (ref 3.5–5.0)
Alkaline Phosphatase: 78 U/L (ref 38–126)
Anion gap: 8 (ref 5–15)
BUN: 7 mg/dL (ref 6–20)
CO2: 29 mmol/L (ref 22–32)
Calcium: 8.7 mg/dL — ABNORMAL LOW (ref 8.9–10.3)
Chloride: 100 mmol/L (ref 98–111)
Creatinine, Ser: <0.3 mg/dL — ABNORMAL LOW (ref 0.61–1.24)
Glucose, Bld: 120 mg/dL — ABNORMAL HIGH (ref 70–99)
Potassium: 4.3 mmol/L (ref 3.5–5.1)
Sodium: 137 mmol/L (ref 135–145)
Total Bilirubin: 0.3 mg/dL (ref 0.3–1.2)
Total Protein: 6.4 g/dL — ABNORMAL LOW (ref 6.5–8.1)

## 2020-12-17 LAB — CBC WITH DIFFERENTIAL/PLATELET
Abs Immature Granulocytes: 0.02 10*3/uL (ref 0.00–0.07)
Basophils Absolute: 0 10*3/uL (ref 0.0–0.1)
Basophils Relative: 1 %
Eosinophils Absolute: 0.1 10*3/uL (ref 0.0–0.5)
Eosinophils Relative: 1 %
HCT: 43.1 % (ref 39.0–52.0)
Hemoglobin: 13.9 g/dL (ref 13.0–17.0)
Immature Granulocytes: 1 %
Lymphocytes Relative: 27 %
Lymphs Abs: 1.2 10*3/uL (ref 0.7–4.0)
MCH: 29.4 pg (ref 26.0–34.0)
MCHC: 32.3 g/dL (ref 30.0–36.0)
MCV: 91.1 fL (ref 80.0–100.0)
Monocytes Absolute: 0.6 10*3/uL (ref 0.1–1.0)
Monocytes Relative: 13 %
Neutro Abs: 2.6 10*3/uL (ref 1.7–7.7)
Neutrophils Relative %: 57 %
Platelets: UNDETERMINED 10*3/uL (ref 150–400)
RBC: 4.73 MIL/uL (ref 4.22–5.81)
RDW: 15.2 % (ref 11.5–15.5)
WBC: 4.4 10*3/uL (ref 4.0–10.5)
nRBC: 0 % (ref 0.0–0.2)

## 2020-12-17 LAB — D-DIMER, QUANTITATIVE: D-Dimer, Quant: 0.93 ug/mL-FEU — ABNORMAL HIGH (ref 0.00–0.50)

## 2020-12-17 LAB — PHOSPHORUS: Phosphorus: 3.7 mg/dL (ref 2.5–4.6)

## 2020-12-17 LAB — FERRITIN: Ferritin: 95 ng/mL (ref 24–336)

## 2020-12-17 LAB — MAGNESIUM: Magnesium: 2 mg/dL (ref 1.7–2.4)

## 2020-12-17 LAB — C-REACTIVE PROTEIN: CRP: 16.7 mg/dL — ABNORMAL HIGH (ref <1.0)

## 2020-12-17 MED ORDER — ASCORBIC ACID 500 MG PO TABS
500.0000 mg | ORAL_TABLET | Freq: Every day | ORAL | Status: DC
  Administered 2020-12-18 – 2020-12-19 (×2): 500 mg
  Filled 2020-12-17 (×2): qty 1

## 2020-12-17 MED ORDER — VITAL 1.5 CAL PO LIQD
1275.0000 mL | ORAL | Status: DC
  Administered 2020-12-17: 1275 mL
  Filled 2020-12-17 (×3): qty 2000

## 2020-12-17 MED ORDER — PREDNISONE 20 MG PO TABS: 50.0000 mg | ORAL_TABLET | Freq: Every day | ORAL | Status: DC

## 2020-12-17 MED ORDER — JUVEN PO PACK
1.0000 | PACK | Freq: Two times a day (BID) | ORAL | Status: DC
  Administered 2020-12-17 – 2020-12-19 (×5): 1
  Filled 2020-12-17 (×5): qty 1

## 2020-12-17 MED ORDER — DOXYCYCLINE HYCLATE 50 MG PO CAPS
50.0000 mg | ORAL_CAPSULE | Freq: Every day | ORAL | Status: DC
  Administered 2020-12-17 – 2020-12-18 (×2): 50 mg
  Filled 2020-12-17 (×2): qty 1

## 2020-12-17 MED ORDER — METHYLPREDNISOLONE SODIUM SUCC 40 MG IJ SOLR
20.0000 mg | Freq: Two times a day (BID) | INTRAMUSCULAR | Status: AC
  Administered 2020-12-17 – 2020-12-19 (×5): 20 mg via INTRAVENOUS
  Filled 2020-12-17 (×5): qty 1

## 2020-12-17 MED ORDER — VANCOMYCIN HCL 1000 MG/200ML IV SOLN
1000.0000 mg | Freq: Every day | INTRAVENOUS | Status: DC
  Administered 2020-12-17 – 2020-12-18 (×2): 1000 mg via INTRAVENOUS
  Filled 2020-12-17 (×2): qty 200

## 2020-12-17 MED ORDER — SODIUM CHLORIDE 0.9 % IV SOLN
1.5000 g | Freq: Four times a day (QID) | INTRAVENOUS | Status: DC
  Administered 2020-12-17 – 2020-12-18 (×5): 1.5 g via INTRAVENOUS
  Filled 2020-12-17 (×5): qty 4
  Filled 2020-12-17: qty 1.5
  Filled 2020-12-17: qty 4
  Filled 2020-12-17: qty 1.5

## 2020-12-17 NOTE — Consult Note (Signed)
WOC Nurse wound follow up   Consulted again for sacral wounds.  I evaluated wounds in the ED and recommended aquacel Ag (LAWSON # [id number]) to sacral wounds as well as scrotal breakdown.  Condom cath in place. Recommend mattress with low air feature and Prevalon boots bilaterally. No disposable briefs or underpads while in bed.  Will not follow at this time.  Please re-consult if needed.  Maple Hudson MSN, RN, FNP-BC CWON Wound, Ostomy, Continence Nurse Pager [phone]

## 2020-12-17 NOTE — Progress Notes (Signed)
Pharmacy Antibiotic Note  Bryan [name] is a 25 y.o. male admitted on 12/16/2020 with  aspiration pneumonia .  Pharmacy has been consulted for Unasyn dosing. Scr low at <0.3  Plan: Start Unasyn 1.5 gm IV q6hr Monitor renal function, clinical status, and culture data.  Height: 5\' 3"  (160 cm) Weight: 44.3 kg (97 lb 10.6 oz) IBW/kg (Calculated) : 56.9  Temp (24hrs), Avg:98.8 F (37.1 C), Min:98.6 F (37 C), Max:99.2 F (37.3 C)  Recent Labs  Lab 12/16/20 0510 12/16/20 1527 12/16/20 1832 12/17/20 0130 12/17/20 0626  WBC 5.8  --   --  4.4  --   CREATININE 0.38*  --   --   --  <0.30*  LATICACIDVEN 6.3* 1.3 1.2  --   --     CrCl cannot be calculated (This lab value cannot be used to calculate CrCl because it is not a number: <0.30).    Allergies  Allergen Reactions   Naproxen Other (See Comments)    Cannot have due to illness   Other Rash    Peach colored rubber tape: Caused rash (childhood allergy)     Antimicrobials this admission: 6/9 Vanc >>  6/9 Unasyn >> 6/9 Doxy >> 6/8 Remdesivirr>>   Microbiology results: 6/7 [facility] BCx: 2/2 GPC 6/8 BCx: 1/2 staph epi (mecA+)   Thank you for allowing pharmacy to be a part of this patient's care. 8/8 12/17/2020 12:45 PM

## 2020-12-17 NOTE — Progress Notes (Signed)
PROGRESS NOTE                                                                                                                                                                                                             Patient Demographics:    Bryan [name], is a 25 y.o. male, DOB - [birth year], WUJ:[id number]  Outpatient Primary MD for the patient is Pediatrics, [place]   Admit date - 12/16/2020   LOS - 1  Chief Complaint  Patient presents with   Covid SOB Chest congestionFever       Brief Narrative: Patient is a 25 y.o. 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  At home-patient developed worsening shortness of breath requiring more oxygen-as a result patient was brought to the hospital for further evaluation and treatment.  COVID-19 vaccinated status: Vaccinated  Significant Events: 6/7>> evaluated at [facility] ED-for fever-COVID-positive-given Remdesivir-discharged home.  6/8 >>admit to [facility] for worsening hypoxemia  Significant studies: 6/8>>Chest x-ray: Left base infiltrate  COVID-19 medications: Steroids: 6/8>> Remdesivir: 6/7>>  Antibiotics: Vancomycin: 6/8>> Unasyn: 6/9>>  Microbiology data: 6/7 >>blood culture: Gram-positive cocci (done at [facility]) 6/8>> blood culture: Coag positive staph  Procedures: None  Consults: None  DVT prophylaxis: enoxaparin (LOVENOX) injection 40 mg Start: 12/16/20 0930    Subjective:    Bryan [name] today is afebrile this morning-he was on 5 L via trach collar-was titrated down to 3 L.  Per  nursing report-he continues to have significant trach output-requiring frequent suctioning.   Assessment  & Plan :   Acute Hypoxic Resp Failure due to Covid 19 Viral pneumonia and aspiration pneumonia: Suspect his hypoxia is due to a combination of COVID-19 infection and aspiration pneumonia.  Highly likely that COVID-19 infection caused him to have more weakness that likely caused aspiration.  Hypoxia has improved-he is down to 3 L of oxygen this morning-continue steroid/Remdesivir-plans are to start Unasyn to cover aspiration.  Fever: afebrile O2 requirements:  SpO2: 95 % O2 Flow Rate (L/min): 3 L/min FiO2 (%): 28 %   COVID-19 Labs: Recent Labs  12/16/20 1527 12/17/20 0130 12/17/20 0626  DDIMER 1.52* 0.93*  --   FERRITIN 106  --  95  LDH 163  --   --   CRP 20.9*  --  16.7*    No results found for: BNP  Recent Labs  Lab 12/16/20 1527  PROCALCITON <0.10    No results found for: SARSCOV2NAA   Gram-positive bacteremia: Likely coag negative staph-patient has hardware-has had prior spine stabilization-has stage IV sacral decubitus-has had cultures positive at [facility] done on 6/7 and also cultures positive done here at [facility] on 6/8.  Discussed case with infectious disease MD-Dr. Comer-recommends that we continue with IV vancomycin pending further culture data.  He is contracted-has trach/PEG-suspect he will not be an appropriate candidate for further intervention-and it may be just prudent to treat him for a total of 2 weeks with a appropriate antimicrobial therapy regimen.  Stage IV pressure ulcer: Chronic issue-followed by wound care clinic in the outpatient setting-appreciate wound care team input.  Cerebral palsy with spastic quadriplegia/lower extremity contractures-s/p trach-GJ tube dependence: Continue tube feeds-remains on bethanechol/cyproheptadine/Valium/Prevacid/Keppra.  RN pressure injury documentation: Pressure Injury 12/16/20  Buttocks Stage 2 -  Partial thickness loss of dermis presenting as a shallow open injury with a red, pink wound bed without slough. (Active)  12/16/20 1530  Location: Buttocks  Location Orientation:   Staging: Stage 2 -  Partial thickness loss of dermis presenting as a shallow open injury with a red, pink wound bed without slough.  Wound Description (Comments):   Present on Admission: Yes    GI prophylaxis: PPI  ABG:    Component Value Date/Time   HCO3 39.2 (H) 09/30/2007 1636   TCO2 41 09/30/2007 1636    Vent Settings:  FiO2 (%):  [28 %] 28 %  Condition - Guarded  Family Communication  :  Mother, [name], [phone] updated over the phone on 6/9  Code Status :  Full Code  Diet :  Diet Order     None        Disposition Plan  :   Status is: Inpatient  Remains inpatient appropriate because:Inpatient level of care appropriate due to severity of illness  Dispo: The patient is from: Home              Anticipated d/c is to: Home              Patient currently is not medically stable to d/c.   Difficult to place patient No   Barriers to discharge: Hypoxia requiring O2 supplementation/complete 5 days of IV Remdesivir/IV Unasyn for presumed aspiration pneumonia-IV vancomycin for gram-positive cocci bacteremia.  Not yet stable for discharge.  Antimicorbials  :    Anti-infectives (From admission, onward)    Start     Dose/Rate Route Frequency Ordered Stop   12/17/20 1000  doxycycline (VIBRAMYCIN) 50 MG/5ML syrup 50 mg  Status:  Discontinued        50 mg Per Tube Daily 12/16/20 2030 12/17/20 0758   12/17/20 1000  doxycycline (VIBRAMYCIN) 50 MG capsule 50 mg        50 mg Per Tube Daily 12/17/20 0759     12/17/20 0430  vancomycin (VANCOREADY) IVPB 1000 mg/200 mL        1,000 mg 200 mL/hr over 60 Minutes Intravenous Daily 12/17/20 0335     12/16/20 1800  remdesivir 100 mg in sodium chloride 0.9 % 100 mL IVPB  Status:  Discontinued  100 mg 200 mL/hr over  30 Minutes Intravenous Daily 12/16/20 0725 12/16/20 0738   12/16/20 1000  remdesivir 100 mg in sodium chloride 0.9 % 100 mL IVPB        100 mg 200 mL/hr over 30 Minutes Intravenous Daily 12/16/20 0738 12/20/20 1759   12/16/20 1000  doxycycline (VIBRAMYCIN) suspension 50 mg  Status:  Discontinued       Note to Pharmacy: For 30 days     50 mg Per J Tube Daily 12/16/20 0928 12/16/20 2026       Inpatient Medications  Scheduled Meds:  vitamin C  500 mg Oral Daily   bethanechol  5 mg Per Tube BID   budesonide  0.5 mg Inhalation BID   cetirizine HCl  10 mg Per J Tube Daily   cyproheptadine  2 mg Per J Tube BID   docusate  100 mg Per Tube BID   doxycycline  50 mg Per Tube Daily   enoxaparin (LOVENOX) injection  40 mg Subcutaneous Q24H   feeding supplement (PROSource TF)  45 mL Per Tube Daily   feeding supplement (VITAL 1.5 CAL)  1,275 mL Per Tube Q24H   fluticasone  1-2 spray Each Nare Daily   free water  150 mL Per Tube Q4H   levETIRAcetam  1,000 mg Per J Tube BID   methylPREDNISolone (SOLU-MEDROL) injection  20 mg Intravenous Q12H   Followed by   Melene Muller[START ON 12/20/2020] predniSONE  50 mg Per Tube Daily   pantoprazole sodium  40 mg Per Tube Daily   PHENObarbital  40.5 mg Per J Tube QHS   potassium chloride  40 mEq Per Tube Once   sodium chloride flush  3 mL Intravenous Q12H   sodium chloride flush  3 mL Intravenous Q12H   zinc sulfate  220 mg Per Tube Daily   Continuous Infusions:  sodium chloride     remdesivir 100 mg in NS 100 mL Stopped (12/16/20 1251)   vancomycin Stopped (12/17/20 0543)   PRN Meds:.sodium chloride, acetaminophen, albuterol, albuterol, bisacodyl, chlorpheniramine-HYDROcodone, diazepam, guaiFENesin-dextromethorphan, ondansetron **OR** ondansetron (ZOFRAN) IV, oxyCODONE, polyethylene glycol, sodium chloride flush, sodium phosphate   Time Spent in minutes  35   See all Orders from today for further details   Jeoffrey MassedShanker Doyle Kunath M.D on 12/17/2020 at 11:52 AM  To  page go to [email or web address] - use universal password  [facility] -  Office  [phone]    Objective:   Vitals:   12/17/20 0809 12/17/20 0957 12/17/20 1000 12/17/20 1126  BP: (!) 90/50  (!) 102/50 109/76  Pulse: 72 84 80 84  Resp: 13 15 16 14   Temp:    98.7 F (37.1 C)  TempSrc:    Axillary  SpO2: 96% 98% 98% 95%  Weight:      Height:        Wt Readings from Last 3 Encounters:  12/17/20 44.3 kg     Intake/Output Summary (Last 24 hours) at 12/17/2020 1152 Last data filed at 12/17/2020 0626 Gross per 24 hour  Intake 1299 ml  Output --  Net 1299 ml     Physical Exam Gen Exam: Awake-but not in any distress. HEENT:atraumatic, normocephalic Chest: Some transmitted upper airway sounds. CVS:S1S2 regular Abdomen: Soft-distended-GJ tube in place.  Midline incision. Extremities: Contracted lower extremities Neurology: Spastic quadriparesis Skin: no rash   Data Review:    CBC Recent Labs  Lab 12/16/20 0510 12/17/20 0130  WBC 5.8 4.4  HGB 17.0 13.9  HCT 52.7* 43.1  PLT 232 PLATELET CLUMPS NOTED ON SMEAR, UNABLE TO ESTIMATE  MCV 91.8 91.1  MCH 29.6 29.4  MCHC 32.3 32.3  RDW 14.9 15.2  LYMPHSABS 0.9 1.2  MONOABS 0.8 0.6  EOSABS 0.1 0.1  BASOSABS 0.0 0.0    Chemistries  Recent Labs  Lab 12/16/20 0510 12/17/20 0626  NA 136 137  K 3.3* 4.3  CL 95* 100  CO2 23 29  GLUCOSE 110* 120*  BUN 6 7  CREATININE 0.38* <0.30*  CALCIUM 9.4 8.7*  MG  --  2.0  AST 36 20  ALT 29 19  ALKPHOS 148* 78  BILITOT 0.5 0.3   ------------------------------------------------------------------------------------------------------------------ No results for input(s): CHOL, HDL, LDLCALC, TRIG, CHOLHDL, LDLDIRECT in the last 72 hours.  No results found for: HGBA1C ------------------------------------------------------------------------------------------------------------------ No results for input(s): TSH, T4TOTAL, T3FREE, THYROIDAB in the last 72 hours.  Invalid  input(s): FREET3 ------------------------------------------------------------------------------------------------------------------ Recent Labs    12/16/20 1527 12/17/20 0626  FERRITIN 106 95    Coagulation profile Recent Labs  Lab 12/16/20 0510  INR 1.1    Recent Labs    12/16/20 1527 12/17/20 0130  DDIMER 1.52* 0.93*    Cardiac Enzymes No results for input(s): CKMB, TROPONINI, MYOGLOBIN in the last 168 hours.  Invalid input(s): CK ------------------------------------------------------------------------------------------------------------------ No results found for: BNP  Micro Results Recent Results (from the past 240 hour(s))  Blood culture (routine single)     Status: None (Preliminary result)   Collection Time: 12/16/20  5:12 AM   Specimen: BLOOD RIGHT WRIST  Result Value Ref Range Status   Specimen Description BLOOD RIGHT WRIST  Final   Special Requests   Final    BOTTLES DRAWN AEROBIC AND ANAEROBIC Blood Culture adequate volume   Culture  Setup Time   Final    GRAM POSITIVE COCCI IN CLUSTERS IN BOTH AEROBIC AND ANAEROBIC BOTTLES CRITICAL RESULT CALLED TO, READ BACK BY AND VERIFIED WITH: Ronney Lion [id number] FCP Performed at [facility], [address] [place], [place] [address]    Culture GRAM POSITIVE COCCI  Final   Report Status PENDING  Incomplete  Blood Culture ID Panel (Reflexed)     Status: Abnormal   Collection Time: 12/16/20  5:12 AM  Result Value Ref Range Status   Enterococcus faecalis NOT DETECTED NOT DETECTED Final   Enterococcus Faecium NOT DETECTED NOT DETECTED Final   Listeria monocytogenes NOT DETECTED NOT DETECTED Final   Staphylococcus species DETECTED (A) NOT DETECTED Final    Comment: CRITICAL RESULT CALLED TO, READ BACK BY AND VERIFIED WITH: PHARMD KAREN A. [id number] FCP    Staphylococcus aureus (BCID) NOT DETECTED NOT DETECTED Final   Staphylococcus epidermidis DETECTED (A) NOT DETECTED Final    Comment:  Methicillin (oxacillin) resistant coagulase negative staphylococcus. Possible blood culture contaminant (unless isolated from more than one blood culture draw or clinical case suggests pathogenicity). No antibiotic treatment is indicated for blood  culture contaminants. CRITICAL RESULT CALLED TO, READ BACK BY AND VERIFIED WITH: PHARMD KAREN A. [id number] FCP    Staphylococcus lugdunensis NOT DETECTED NOT DETECTED Final   Streptococcus species NOT DETECTED NOT DETECTED Final   Streptococcus agalactiae NOT DETECTED NOT DETECTED Final   Streptococcus pneumoniae NOT DETECTED NOT DETECTED Final   Streptococcus pyogenes NOT DETECTED NOT DETECTED Final   A.calcoaceticus-baumannii NOT DETECTED NOT DETECTED Final   Bacteroides fragilis NOT DETECTED NOT DETECTED Final   Enterobacterales NOT DETECTED NOT DETECTED Final   Enterobacter cloacae complex NOT DETECTED NOT DETECTED Final  Escherichia coli NOT DETECTED NOT DETECTED Final   Klebsiella aerogenes NOT DETECTED NOT DETECTED Final   Klebsiella oxytoca NOT DETECTED NOT DETECTED Final   Klebsiella pneumoniae NOT DETECTED NOT DETECTED Final   Proteus species NOT DETECTED NOT DETECTED Final   Salmonella species NOT DETECTED NOT DETECTED Final   Serratia marcescens NOT DETECTED NOT DETECTED Final   Haemophilus influenzae NOT DETECTED NOT DETECTED Final   Neisseria meningitidis NOT DETECTED NOT DETECTED Final   Pseudomonas aeruginosa NOT DETECTED NOT DETECTED Final   Stenotrophomonas maltophilia NOT DETECTED NOT DETECTED Final   Candida albicans NOT DETECTED NOT DETECTED Final   Candida auris NOT DETECTED NOT DETECTED Final   Candida glabrata NOT DETECTED NOT DETECTED Final   Candida krusei NOT DETECTED NOT DETECTED Final   Candida parapsilosis NOT DETECTED NOT DETECTED Final   Candida tropicalis NOT DETECTED NOT DETECTED Final   Cryptococcus neoformans/gattii NOT DETECTED NOT DETECTED Final   Methicillin resistance mecA/C DETECTED (A) NOT  DETECTED Final    Comment: CRITICAL RESULT CALLED TO, READ BACK BY AND VERIFIED WITH: Ronney Lion [id number] FCP Performed at [facility], [address] [place], [place] [address]     Radiology Reports DG Chest Outlook 1 View  Result Date: 12/16/2020 CLINICAL DATA:  Questionable sepsis. EXAM: PORTABLE CHEST 1 VIEW COMPARISON:  11/15/2009. FINDINGS: Tracheostomy tube noted with tip over the trachea. Heart size normal. Bibasilar atelectasis. Left base infiltrate suggesting pneumonia noted. No pleural effusion or pneumothorax. Thoracic spine scoliosis with thoracolumbar spine fusion. Hardware intact. IMPRESSION: 1.  Tracheostomy tube in good anatomic position. 2. Bibasilar atelectasis. Left base infiltrate consistent pneumonia. Electronically Signed   By: Maisie Fus  Register   On: 12/16/2020 05:26

## 2020-12-17 NOTE — Progress Notes (Signed)
Pharmacy Antibiotic Note  Bryan [name] is a 25 y.o. male admitted on 12/16/2020 with bacteremia.  Pharmacy has been consulted for Vancomycin dosing.  Lab called and pt with staph epi (+mecA resistance) in 1/2 blood culture bottles. I reviewed note from Dr. Ophelia Charter about [facility] calling pt's mother to tell her that blood cultures were positive there also but no information in Epic. I called and spoke with [facility] pharmacist who said that pt had 2/2 blood cultures positive for GPC but no speciation yet. Discussed findings with Dr. Loney Loh. Pt currently on no antibiotics.  SCr 0.38, may not be reflective of renal function in pt with cerebral palsy/spastic quadriplegia.  Plan: Vancomycin 1000mg  Q 24 hrs. Goal AUC 400-550. Expected AUC: 440 SCr used: 0.8 Will f/u renal function/UOP, micro data (F/u with blood cx at [facility]) and pt's clinical condition Vanc levels at [facility]     Temp (24hrs), Avg:100.2 F (37.9 C), Min:98.6 F (37 C), Max:102.8 F (39.3 C)  Recent Labs  Lab 12/16/20 0510 12/16/20 1527 12/16/20 1832 12/17/20 0130  WBC 5.8  --   --  4.4  CREATININE 0.38*  --   --   --   LATICACIDVEN 6.3* 1.3 1.2  --     CrCl cannot be calculated (Unknown ideal weight.).    Allergies  Allergen Reactions   Naproxen Other (See Comments)    Cannot have due to illness   Other Rash    Peach colored rubber tape: Caused rash (childhood allergy)     Antimicrobials this admission: 6/9 Vanc >>   Microbiology results: 6/7 [facility] BCx: 2/2 GPC 6/8 BCx: 1/2 staph epi (mecA+)  Thank you for allowing pharmacy to be a part of this patient's care.  8/8, PharmD, BCPS Please see amion for complete clinical pharmacist phone list 12/17/2020 3:22 AM  PHARMACY - PHYSICIAN COMMUNICATION CRITICAL VALUE ALERT - BLOOD CULTURE IDENTIFICATION (BCID)   Name of physician (or Provider) Contacted: [place]  Current antibiotics: None  Changes to prescribed antibiotics recommended:   Start Vancomycin  Results for orders placed or performed during the hospital encounter of 12/16/20  Blood Culture ID Panel (Reflexed) (Collected: 12/16/2020  5:12 AM)  Result Value Ref Range   Enterococcus faecalis NOT DETECTED NOT DETECTED   Enterococcus Faecium NOT DETECTED NOT DETECTED   Listeria monocytogenes NOT DETECTED NOT DETECTED   Staphylococcus species DETECTED (A) NOT DETECTED   Staphylococcus aureus (BCID) NOT DETECTED NOT DETECTED   Staphylococcus epidermidis DETECTED (A) NOT DETECTED   Staphylococcus lugdunensis NOT DETECTED NOT DETECTED   Streptococcus species NOT DETECTED NOT DETECTED   Streptococcus agalactiae NOT DETECTED NOT DETECTED   Streptococcus pneumoniae NOT DETECTED NOT DETECTED   Streptococcus pyogenes NOT DETECTED NOT DETECTED   A.calcoaceticus-baumannii NOT DETECTED NOT DETECTED   Bacteroides fragilis NOT DETECTED NOT DETECTED   Enterobacterales NOT DETECTED NOT DETECTED   Enterobacter cloacae complex NOT DETECTED NOT DETECTED   Escherichia coli NOT DETECTED NOT DETECTED   Klebsiella aerogenes NOT DETECTED NOT DETECTED   Klebsiella oxytoca NOT DETECTED NOT DETECTED   Klebsiella pneumoniae NOT DETECTED NOT DETECTED   Proteus species NOT DETECTED NOT DETECTED   Salmonella species NOT DETECTED NOT DETECTED   Serratia marcescens NOT DETECTED NOT DETECTED   Haemophilus influenzae NOT DETECTED NOT DETECTED   Neisseria meningitidis NOT DETECTED NOT DETECTED   Pseudomonas aeruginosa NOT DETECTED NOT DETECTED   Stenotrophomonas maltophilia NOT DETECTED NOT DETECTED   Candida albicans NOT DETECTED NOT DETECTED   Candida auris  NOT DETECTED NOT DETECTED   Candida glabrata NOT DETECTED NOT DETECTED   Candida krusei NOT DETECTED NOT DETECTED   Candida parapsilosis NOT DETECTED NOT DETECTED   Candida tropicalis NOT DETECTED NOT DETECTED   Cryptococcus neoformans/gattii NOT DETECTED NOT DETECTED   Methicillin resistance mecA/C DETECTED (A) NOT DETECTED

## 2020-12-18 LAB — CBC WITH DIFFERENTIAL/PLATELET
Abs Immature Granulocytes: 0.01 10*3/uL (ref 0.00–0.07)
Basophils Absolute: 0 10*3/uL (ref 0.0–0.1)
Basophils Relative: 0 %
Eosinophils Absolute: 0 10*3/uL (ref 0.0–0.5)
Eosinophils Relative: 1 %
HCT: 45.3 % (ref 39.0–52.0)
Hemoglobin: 14.5 g/dL (ref 13.0–17.0)
Immature Granulocytes: 0 %
Lymphocytes Relative: 24 %
Lymphs Abs: 0.9 10*3/uL (ref 0.7–4.0)
MCH: 29.5 pg (ref 26.0–34.0)
MCHC: 32 g/dL (ref 30.0–36.0)
MCV: 92.3 fL (ref 80.0–100.0)
Monocytes Absolute: 0.3 10*3/uL (ref 0.1–1.0)
Monocytes Relative: 9 %
Neutro Abs: 2.3 10*3/uL (ref 1.7–7.7)
Neutrophils Relative %: 66 %
Platelets: 212 10*3/uL (ref 150–400)
RBC: 4.91 MIL/uL (ref 4.22–5.81)
RDW: 15 % (ref 11.5–15.5)
WBC: 3.5 10*3/uL — ABNORMAL LOW (ref 4.0–10.5)
nRBC: 0 % (ref 0.0–0.2)

## 2020-12-18 LAB — PHOSPHORUS: Phosphorus: 3.6 mg/dL (ref 2.5–4.6)

## 2020-12-18 LAB — COMPREHENSIVE METABOLIC PANEL
ALT: 22 U/L (ref 0–44)
AST: 23 U/L (ref 15–41)
Albumin: 3.8 g/dL (ref 3.5–5.0)
Alkaline Phosphatase: 96 U/L (ref 38–126)
Anion gap: 12 (ref 5–15)
BUN: 7 mg/dL (ref 6–20)
CO2: 25 mmol/L (ref 22–32)
Calcium: 9.2 mg/dL (ref 8.9–10.3)
Chloride: 100 mmol/L (ref 98–111)
Creatinine, Ser: <0.3 mg/dL — ABNORMAL LOW (ref 0.61–1.24)
Glucose, Bld: 110 mg/dL — ABNORMAL HIGH (ref 70–99)
Potassium: 4.3 mmol/L (ref 3.5–5.1)
Sodium: 137 mmol/L (ref 135–145)
Total Bilirubin: 0.3 mg/dL (ref 0.3–1.2)
Total Protein: 7.8 g/dL (ref 6.5–8.1)

## 2020-12-18 LAB — D-DIMER, QUANTITATIVE: D-Dimer, Quant: 0.48 ug/mL-FEU (ref 0.00–0.50)

## 2020-12-18 LAB — FERRITIN: Ferritin: 90 ng/mL (ref 24–336)

## 2020-12-18 LAB — C-REACTIVE PROTEIN: CRP: 10.5 mg/dL — ABNORMAL HIGH (ref <1.0)

## 2020-12-18 LAB — MAGNESIUM: Magnesium: 1.7 mg/dL (ref 1.7–2.4)

## 2020-12-18 MED ORDER — ORITAVANCIN DIPHOSPHATE 400 MG IV SOLR
1200.0000 mg | Freq: Once | INTRAVENOUS | Status: AC
  Administered 2020-12-18: 1200 mg via INTRAVENOUS
  Filled 2020-12-18: qty 120

## 2020-12-18 MED ORDER — MAGNESIUM SULFATE 2 GM/50ML IV SOLN
2.0000 g | Freq: Once | INTRAVENOUS | Status: AC
  Administered 2020-12-18: 2 g via INTRAVENOUS
  Filled 2020-12-18: qty 50

## 2020-12-18 NOTE — Consult Note (Addendum)
[facility]    Date of Admission:  12/16/2020   Total days of antibiotics 3               Reason for Consult: Enterococcus bacteremia    Referring Provider: Jeoffrey Massed, MD Primary Care Provider:   Assessment: Patient with history of cerebral palsy, quadriplegic with trach/PEG dependent who presented to the hospital for dyspnea secondary to COVID-19 infection.  Currently treated with steroid and remdesivir.  Also on Unasyn for possible aspiration pneumonia.  Blood culture here grew staph hominis and epididymis, methicillin resistant.  Blood culture at [facility] grew staph hemolyticus and Enterococcus faecalis.  Pending sensitivity.  Given his blood culture grew coagulase negative staph at 2 different settings, will treat his bacteremia.  The source of his infection is likely the sacral wounds.  Patient also had hardware with spinal fusion in the past which can also be a source.  Low risk for endocarditis so will defer TTE.  Will treat with 1 dose of oritavancin, which covers 2 weeks of treatment.  Discontinue vancomycin and Unasyn.  Will also discontinue doxycycline due to the same coverage with dalbavancin.   Plan: 1 dose of oritavancin Discontinue vancomycin, Unasyn and doxycycline Continue remdesivir and steroids for COVID-19 infection Follow-up on repeat blood culture Continue wound care Follow-up with dermatology outpatient to determine continuation of doxycycline  Principal Problem:   COVID-19 Active Problems:   Cerebral palsy, quadriplegic (HCC)   Tracheostomy present (HCC)   Gastrointestinal tube present (HCC)   Pressure ulcer of coccygeal region, stage III (HCC)   Scheduled Meds:  vitamin C  500 mg Per Tube Daily   bethanechol  5 mg Per Tube BID   budesonide  0.5 mg Inhalation BID   cetirizine HCl  10 mg Per J Tube Daily   cyproheptadine  2 mg Per J Tube BID   enoxaparin (LOVENOX) injection  40 mg Subcutaneous Q24H   feeding  supplement (PROSource TF)  45 mL Per Tube Daily   feeding supplement (VITAL 1.5 CAL)  1,275 mL Per Tube Q24H   fluticasone  1-2 spray Each Nare Daily   free water  150 mL Per Tube Q4H   levETIRAcetam  1,000 mg Per J Tube BID   methylPREDNISolone (SOLU-MEDROL) injection  20 mg Intravenous Q12H   Followed by   Melene Muller ON 12/20/2020] predniSONE  50 mg Per Tube Daily   nutrition supplement (JUVEN)  1 packet Per Tube BID BM   pantoprazole sodium  40 mg Per Tube Daily   PHENObarbital  40.5 mg Per J Tube QHS   sodium chloride flush  3 mL Intravenous Q12H   sodium chloride flush  3 mL Intravenous Q12H   zinc sulfate  220 mg Per Tube Daily   Continuous Infusions:  sodium chloride     remdesivir 100 mg in NS 100 mL Stopped (12/17/20 1804)   PRN Meds:.sodium chloride, acetaminophen, albuterol, albuterol, chlorpheniramine-HYDROcodone, diazepam, guaiFENesin-dextromethorphan, ondansetron **OR** ondansetron (ZOFRAN) IV, oxyCODONE, sodium chloride flush  HPI: Bryan [name] is a 25 y.o. male with past medical history of cerebral palsy, spastic quadriplegic, trach and PEG dependence, stage II sacral ulcer, who presented to the hospital for shortness of breath.  Patient was diagnosed with COVID-19 infection on 6/7 and was treated with remdesivir and discharged.  Patient came back to the hospital for worsening hypoxia.  Currently treated for COVID-19 infection with remdesivir and steroid.  Patient is seen at  bedside.  He is nonverbal and unable to hold a conversation.  He appears in no acute distress.  I have called and spoken to patient's mother.  She states that patient has been taking doxycycline prescribed by his dermatologist started a few months ago for wounds on his face.  Last saw dermatologist 1 month ago.  Review of Systems: ROS Unable to obtain  Past Medical History:  Diagnosis Date   Cerebral palsy, quadriplegic (HCC)    Gastrointestinal tube present (HCC)    GJ tube   Pressure  ulcer of coccygeal region, stage III (HCC)    Tracheostomy present (HCC)     Social History   Tobacco Use   Smoking status: Never   Smokeless tobacco: Never  Substance Use Topics   Alcohol use: Never   Drug use: Never    History reviewed. No pertinent family history. Allergies  Allergen Reactions   Naproxen Other (See Comments)    Cannot have due to illness   Other Rash    Peach colored rubber tape: Caused rash (childhood allergy)     OBJECTIVE: Blood pressure (!) 169/122, pulse (!) 128, temperature 98.7 F (37.1 C), temperature source Oral, resp. rate (!) 24, height 5\' 3"  (1.6 m), weight 44.3 kg, SpO2 100 %.  Physical Exam Constitutional:      General: He is not in acute distress.    Appearance: He is ill-appearing.  HENT:     Head: Normocephalic.  Eyes:     General: No scleral icterus.       Right eye: No discharge.        Left eye: No discharge.     Conjunctiva/sclera: Conjunctivae normal.  Cardiovascular:     Rate and Rhythm: Regular rhythm. Tachycardia present.  Pulmonary:     Comments: Harsh upper airway sound.  Trach in place Abdominal:     General: Bowel sounds are normal. There is distension.     Comments: PEG tube in place, site appears clean.  Abdominal baclofen pump palpated.  Genitourinary:    Comments: Rectal tube in place Musculoskeletal:     Comments: Extremities contracted.  Skin:    General: Skin is warm.     Coloration: Skin is not jaundiced.  Neurological:     Mental Status: He is alert.    Lab Results Lab Results  Component Value Date   WBC 3.5 (L) 12/18/2020   HGB 14.5 12/18/2020   HCT 45.3 12/18/2020   MCV 92.3 12/18/2020   PLT 212 12/18/2020    Lab Results  Component Value Date   CREATININE <0.30 (L) 12/18/2020   BUN 7 12/18/2020   NA 137 12/18/2020   K 4.3 12/18/2020   CL 100 12/18/2020   CO2 25 12/18/2020    Lab Results  Component Value Date   ALT 22 12/18/2020   AST 23 12/18/2020   ALKPHOS 96 12/18/2020    BILITOT 0.3 12/18/2020     Microbiology: Recent Results (from the past 240 hour(s))  Blood culture (routine single)     Status: Abnormal (Preliminary result)   Collection Time: 12/16/20  5:12 AM   Specimen: BLOOD RIGHT WRIST  Result Value Ref Range Status   Specimen Description BLOOD RIGHT WRIST  Final   Special Requests   Final    BOTTLES DRAWN AEROBIC AND ANAEROBIC Blood Culture adequate volume   Culture  Setup Time   Final    GRAM POSITIVE COCCI IN CLUSTERS IN BOTH AEROBIC AND ANAEROBIC BOTTLES CRITICAL RESULT CALLED TO, READ  BACK BY AND VERIFIED WITH: PHARMD KAREN A. 0221 [id number] FCP    Culture (A)  Final    STAPHYLOCOCCUS HOMINIS STAPHYLOCOCCUS EPIDERMIDIS THE SIGNIFICANCE OF ISOLATING THIS ORGANISM FROM A SINGLE SET OF BLOOD CULTURES WHEN MULTIPLE SETS ARE DRAWN IS UNCERTAIN. PLEASE NOTIFY THE MICROBIOLOGY DEPARTMENT WITHIN ONE WEEK IF SPECIATION AND SENSITIVITIES ARE REQUIRED. Performed at [facility], [address] [place], [place] [address]    Report Status PENDING  Incomplete  Blood Culture ID Panel (Reflexed)     Status: Abnormal   Collection Time: 12/16/20  5:12 AM  Result Value Ref Range Status   Enterococcus faecalis NOT DETECTED NOT DETECTED Final   Enterococcus Faecium NOT DETECTED NOT DETECTED Final   Listeria monocytogenes NOT DETECTED NOT DETECTED Final   Staphylococcus species DETECTED (A) NOT DETECTED Final    Comment: CRITICAL RESULT CALLED TO, READ BACK BY AND VERIFIED WITH: PHARMD KAREN A. [id number] FCP    Staphylococcus aureus (BCID) NOT DETECTED NOT DETECTED Final   Staphylococcus epidermidis DETECTED (A) NOT DETECTED Final    Comment: Methicillin (oxacillin) resistant coagulase negative staphylococcus. Possible blood culture contaminant (unless isolated from more than one blood culture draw or clinical case suggests pathogenicity). No antibiotic treatment is indicated for blood  culture contaminants. CRITICAL RESULT CALLED TO, READ BACK BY  AND VERIFIED WITH: PHARMD KAREN A. 0221 [id number] FCP    Staphylococcus lugdunensis NOT DETECTED NOT DETECTED Final   Streptococcus species NOT DETECTED NOT DETECTED Final   Streptococcus agalactiae NOT DETECTED NOT DETECTED Final   Streptococcus pneumoniae NOT DETECTED NOT DETECTED Final   Streptococcus pyogenes NOT DETECTED NOT DETECTED Final   A.calcoaceticus-baumannii NOT DETECTED NOT DETECTED Final   Bacteroides fragilis NOT DETECTED NOT DETECTED Final   Enterobacterales NOT DETECTED NOT DETECTED Final   Enterobacter cloacae complex NOT DETECTED NOT DETECTED Final   Escherichia coli NOT DETECTED NOT DETECTED Final   Klebsiella aerogenes NOT DETECTED NOT DETECTED Final   Klebsiella oxytoca NOT DETECTED NOT DETECTED Final   Klebsiella pneumoniae NOT DETECTED NOT DETECTED Final   Proteus species NOT DETECTED NOT DETECTED Final   Salmonella species NOT DETECTED NOT DETECTED Final   Serratia marcescens NOT DETECTED NOT DETECTED Final   Haemophilus influenzae NOT DETECTED NOT DETECTED Final   Neisseria meningitidis NOT DETECTED NOT DETECTED Final   Pseudomonas aeruginosa NOT DETECTED NOT DETECTED Final   Stenotrophomonas maltophilia NOT DETECTED NOT DETECTED Final   Candida albicans NOT DETECTED NOT DETECTED Final   Candida auris NOT DETECTED NOT DETECTED Final   Candida glabrata NOT DETECTED NOT DETECTED Final   Candida krusei NOT DETECTED NOT DETECTED Final   Candida parapsilosis NOT DETECTED NOT DETECTED Final   Candida tropicalis NOT DETECTED NOT DETECTED Final   Cryptococcus neoformans/gattii NOT DETECTED NOT DETECTED Final   Methicillin resistance mecA/C DETECTED (A) NOT DETECTED Final    Comment: CRITICAL RESULT CALLED TO, READ BACK BY AND VERIFIED WITH: Ronney LionHARMD KAREN A. 0221 [id number] FCP Performed at [facility], [address] [place], [place] [address]   Culture, blood (routine x 2)     Status: None (Preliminary result)   Collection Time: 12/17/20 12:37 PM   Specimen:  BLOOD  Result Value Ref Range Status   Specimen Description BLOOD RIGHT ANTECUBITAL  Final   Special Requests   Final    BOTTLES DRAWN AEROBIC ONLY Blood Culture results may not be optimal due to an inadequate volume of blood received in culture bottles   Culture  Final    NO GROWTH < 24 HOURS Performed at [facility], [address] [place], [place] [address]    Report Status PENDING  Incomplete  Culture, blood (routine x 2)     Status: None (Preliminary result)   Collection Time: 12/17/20 12:38 PM   Specimen: BLOOD RIGHT HAND  Result Value Ref Range Status   Specimen Description BLOOD RIGHT HAND  Final   Special Requests   Final    BOTTLES DRAWN AEROBIC ONLY Blood Culture adequate volume   Culture   Final    NO GROWTH < 24 HOURS Performed at [facility], [address] [place], [place] [address]    Report Status PENDING  Incomplete    Doran Stabler, [facility] [phone] pager   [phone] cell 12/18/2020, 2:15 PM

## 2020-12-18 NOTE — Progress Notes (Signed)
Called [facility] to follow up on positive blood cultures drawn at that institution PTA. Patient growing Staph Hemolyticus and now growing Enterococcus faecalis per Brooke(?) at [facility]. Sensitivities will not be ready until 12/19/20. Dr. Jerral Ralph and ID pharmacist, Sharin Mons, informed.   Marilene Vath A. Jeanella Craze, PharmD, BCPS, FNKF Clinical Pharmacist [place] Please utilize Amion for appropriate phone number to reach the unit pharmacist [facility])

## 2020-12-18 NOTE — Progress Notes (Addendum)
PROGRESS NOTE                                                                                                                                                                                                             Patient Demographics:    Bryan [name], is a 25 y.o. male, DOB - 1995-08-19, KYH:[id number]  Outpatient Primary MD for the patient is Pediatrics, [place]   Admit date - 12/16/2020   LOS - 2  Chief Complaint  Patient presents with   Covid SOB Chest congestionFever       Brief Narrative: Patient is a 25 y.o. 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  At home-patient developed worsening shortness of breath requiring more oxygen-as a result patient was brought to the hospital for further evaluation and treatment.  COVID-19 vaccinated status: Vaccinated  Significant Events: 6/7>> evaluated at [facility] ED-for fever-COVID-positive-given Remdesivir-discharged home.  6/8 >>admit to [facility] for worsening hypoxemia  Significant studies: 6/8>>Chest x-ray: Left base infiltrate  COVID-19 medications: Steroids: 6/8>> Remdesivir: 6/7>>  Antibiotics: Vancomycin: 6/8>> Unasyn: 6/9>>  Microbiology data: 6/7 >>blood culture: Gram-positive cocci (done at [facility]) 6/8>> blood culture: Gram-positive cocci 6/9>> blood culture: No growth  Procedures: None  Consults: None  DVT prophylaxis: enoxaparin (LOVENOX) injection 40 mg Start: 12/16/20 0930    Subjective:   Required numerous suctioning last night-tachycardic in the low 100s-some diarrhea  overnight-rectal tube placed (however pouch emptied this morning)-but otherwise appears comfortable.   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  Titrated back up to 5 L overnight-titrated him back down to 3 L.  He appears comfortable-still has significant transmitted upper airway sounds.  Continue pulmonary toileting-frequent suctioning-remains on steroid/Remdesivir and empiric Unasyn.  CRP downtrending.    Fever: afebrile O2 requirements:  SpO2: 97 % O2 Flow Rate (L/min): 5 L/min FiO2 (%): 28 %   COVID-19 Labs: Recent Labs    12/16/20 1527 12/17/20 0130 12/17/20  4270 12/18/20 0221  DDIMER 1.52* 0.93*  --  0.48  FERRITIN 106  --  95 90  LDH 163  --   --   --   CRP 20.9*  --  16.7* 10.5*     No results found for: BNP  Recent Labs  Lab 12/16/20 1527  PROCALCITON <0.10     No results found for: SARSCOV2NAA   Gram-positive bacteremia: Likely coag negative staph-patient has hardware in his spine for prior spine stabilization-also has stage IV sacral decubitus-remains on empiric vancomycin.  Await culture data.  Case was discussed with infectious disease MD-Dr. Luciana Axe 10/9-once culture results are final-patient is closer to discharge-we will touch base with ID again to discuss discharge antimicrobial regimen. He is contracted-has trach/PEG-suspect he will not be an appropriate candidate for further intervention-and it may be just prudent to treat him for a total of 2 weeks with a appropriate antimicrobial therapy regimen.  Stage IV pressure ulcer: Chronic issue-followed by wound care clinic in the outpatient setting-appreciate wound care team input.  Cerebral palsy with spastic quadriplegia/lower extremity contractures-s/p trach-GJ tube dependence: Continue tube feeds-remains on  bethanechol/cyproheptadine/Valium/Prevacid/Keppra.  RN pressure injury documentation: Pressure Injury 12/16/20 Buttocks Stage 2 -  Partial thickness loss of dermis presenting as a shallow open injury with a red, pink wound bed without slough. (Active)  12/16/20 1530  Location: Buttocks  Location Orientation:   Staging: Stage 2 -  Partial thickness loss of dermis presenting as a shallow open injury with a red, pink wound bed without slough.  Wound Description (Comments):   Present on Admission: Yes    GI prophylaxis: PPI  ABG:    Component Value Date/Time   HCO3 39.2 (H) 09/30/2007 1636   TCO2 41 09/30/2007 1636    Vent Settings:  FiO2 (%):  [28 %] 28 %  Condition - Guarded  Family Communication  :  Mother, [name], [phone] left a voicemail on 6/  Code Status :  Full Code  Diet :  Diet Order     None        Disposition Plan  :   Status is: Inpatient  Remains inpatient appropriate because:Inpatient level of care appropriate due to severity of illness  Dispo: The patient is from: Home              Anticipated d/c is to: Home              Patient currently is not medically stable to d/c.   Difficult to place patient No   Barriers to discharge: Hypoxia requiring O2 supplementation/complete 5 days of IV Remdesivir/IV Unasyn for presumed aspiration pneumonia-IV vancomycin for gram-positive cocci bacteremia.  Not yet stable for discharge.  Antimicorbials  :    Anti-infectives (From admission, onward)    Start     Dose/Rate Route Frequency Ordered Stop   12/17/20 1330  ampicillin-sulbactam (UNASYN) 1.5 g in sodium chloride 0.9 % 100 mL IVPB        1.5 g 200 mL/hr over 30 Minutes Intravenous Every 6 hours 12/17/20 1240     12/17/20 1000  doxycycline (VIBRAMYCIN) 50 MG/5ML syrup 50 mg  Status:  Discontinued        50 mg Per Tube Daily 12/16/20 2030 12/17/20 0758   12/17/20 1000  doxycycline (VIBRAMYCIN) 50 MG capsule 50 mg        50 mg Per Tube Daily  12/17/20 0759     12/17/20 0430  vancomycin (VANCOREADY) IVPB 1000 mg/200 mL  1,000 mg 200 mL/hr over 60 Minutes Intravenous Daily 12/17/20 0335     12/16/20 1800  remdesivir 100 mg in sodium chloride 0.9 % 100 mL IVPB  Status:  Discontinued        100 mg 200 mL/hr over 30 Minutes Intravenous Daily 12/16/20 0725 12/16/20 0738   12/16/20 1000  remdesivir 100 mg in sodium chloride 0.9 % 100 mL IVPB        100 mg 200 mL/hr over 30 Minutes Intravenous Daily 12/16/20 0738 12/20/20 1759   12/16/20 1000  doxycycline (VIBRAMYCIN) suspension 50 mg  Status:  Discontinued       Note to Pharmacy: For 30 days     50 mg Per J Tube Daily 12/16/20 0928 12/16/20 2026       Inpatient Medications  Scheduled Meds:  vitamin C  500 mg Per Tube Daily   bethanechol  5 mg Per Tube BID   budesonide  0.5 mg Inhalation BID   cetirizine HCl  10 mg Per J Tube Daily   cyproheptadine  2 mg Per J Tube BID   doxycycline  50 mg Per Tube Daily   enoxaparin (LOVENOX) injection  40 mg Subcutaneous Q24H   feeding supplement (PROSource TF)  45 mL Per Tube Daily   feeding supplement (VITAL 1.5 CAL)  1,275 mL Per Tube Q24H   fluticasone  1-2 spray Each Nare Daily   free water  150 mL Per Tube Q4H   levETIRAcetam  1,000 mg Per J Tube BID   methylPREDNISolone (SOLU-MEDROL) injection  20 mg Intravenous Q12H   Followed by   Melene Muller ON 12/20/2020] predniSONE  50 mg Per Tube Daily   nutrition supplement (JUVEN)  1 packet Per Tube BID BM   pantoprazole sodium  40 mg Per Tube Daily   PHENObarbital  40.5 mg Per J Tube QHS   sodium chloride flush  3 mL Intravenous Q12H   sodium chloride flush  3 mL Intravenous Q12H   zinc sulfate  220 mg Per Tube Daily   Continuous Infusions:  sodium chloride     ampicillin-sulbactam (UNASYN) IV 200 mL/hr at 12/18/20 1000   magnesium sulfate bolus IVPB 2 g (12/18/20 1024)   remdesivir 100 mg in NS 100 mL Stopped (12/17/20 1804)   vancomycin Stopped (12/18/20 0642)   PRN  Meds:.sodium chloride, acetaminophen, albuterol, albuterol, chlorpheniramine-HYDROcodone, diazepam, guaiFENesin-dextromethorphan, ondansetron **OR** ondansetron (ZOFRAN) IV, oxyCODONE, sodium chloride flush   Time Spent in minutes  35   See all Orders from today for further details   Jeoffrey Massed M.D on 12/18/2020 at 11:08 AM  To page go to [email or web address] - use universal password  [facility] -  Office  [phone]    Objective:   Vitals:   12/18/20 0320 12/18/20 0607 12/18/20 0742 12/18/20 0811  BP:   (!) 160/117   Pulse: 90  (!) 110 (!) 116  Resp:   20 18  Temp:  99.7 F (37.6 C) 99.7 F (37.6 C)   TempSrc:  Axillary Oral   SpO2:   99% 97%  Weight:      Height:        Wt Readings from Last 3 Encounters:  12/17/20 44.3 kg     Intake/Output Summary (Last 24 hours) at 12/18/2020 1108 Last data filed at 12/18/2020 1000 Gross per 24 hour  Intake 2373.1 ml  Output 900 ml  Net 1473.1 ml      Physical Exam Gen Exam: Awake-comfortable.  Not in any distress. HEENT:atraumatic, normocephalic  Chest: B/L clear to auscultation anteriorly CVS:S1S2 regular Abdomen: Soft-mildly distended.  G-J-tube in place. Extremities:no edema Neurology: Quadriparesis-contracted lower extremities. Skin: no rash    Data Review:    CBC Recent Labs  Lab 12/16/20 0510 12/17/20 0130 12/18/20 0221  WBC 5.8 4.4 3.5*  HGB 17.0 13.9 14.5  HCT 52.7* 43.1 45.3  PLT 232 PLATELET CLUMPS NOTED ON SMEAR, UNABLE TO ESTIMATE 212  MCV 91.8 91.1 92.3  MCH 29.6 29.4 29.5  MCHC 32.3 32.3 32.0  RDW 14.9 15.2 15.0  LYMPHSABS 0.9 1.2 0.9  MONOABS 0.8 0.6 0.3  EOSABS 0.1 0.1 0.0  BASOSABS 0.0 0.0 0.0     Chemistries  Recent Labs  Lab 12/16/20 0510 12/17/20 0626 12/18/20 0221  NA 136 137 137  K 3.3* 4.3 4.3  CL 95* 100 100  CO2 23 29 25   GLUCOSE 110* 120* 110*  BUN 6 7 7   CREATININE 0.38* <0.30* <0.30*  CALCIUM 9.4 8.7* 9.2  MG  --  2.0 1.7  AST 36 20 23  ALT 29 19 22    ALKPHOS 148* 78 96  BILITOT 0.5 0.3 0.3    ------------------------------------------------------------------------------------------------------------------ No results for input(s): CHOL, HDL, LDLCALC, TRIG, CHOLHDL, LDLDIRECT in the last 72 hours.  No results found for: HGBA1C ------------------------------------------------------------------------------------------------------------------ No results for input(s): TSH, T4TOTAL, T3FREE, THYROIDAB in the last 72 hours.  Invalid input(s): FREET3 ------------------------------------------------------------------------------------------------------------------ Recent Labs    12/17/20 0626 12/18/20 0221  FERRITIN 95 90     Coagulation profile Recent Labs  Lab 12/16/20 0510  INR 1.1     Recent Labs    12/17/20 0130 12/18/20 0221  DDIMER 0.93* 0.48     Cardiac Enzymes No results for input(s): CKMB, TROPONINI, MYOGLOBIN in the last 168 hours.  Invalid input(s): CK ------------------------------------------------------------------------------------------------------------------ No results found for: BNP  Micro Results Recent Results (from the past 240 hour(s))  Blood culture (routine single)     Status: None (Preliminary result)   Collection Time: 12/16/20  5:12 AM   Specimen: BLOOD RIGHT WRIST  Result Value Ref Range Status   Specimen Description BLOOD RIGHT WRIST  Final   Special Requests   Final    BOTTLES DRAWN AEROBIC AND ANAEROBIC Blood Culture adequate volume   Culture  Setup Time   Final    GRAM POSITIVE COCCI IN CLUSTERS IN BOTH AEROBIC AND ANAEROBIC BOTTLES CRITICAL RESULT CALLED TO, READ BACK BY AND VERIFIED WITH: Ronney LionHARMD KAREN A. 0221 [id number] FCP Performed at [facility], [address] [place], [place] [address]    Culture GRAM POSITIVE COCCI  Final   Report Status PENDING  Incomplete  Blood Culture ID Panel (Reflexed)     Status: Abnormal   Collection Time: 12/16/20  5:12 AM  Result Value  Ref Range Status   Enterococcus faecalis NOT DETECTED NOT DETECTED Final   Enterococcus Faecium NOT DETECTED NOT DETECTED Final   Listeria monocytogenes NOT DETECTED NOT DETECTED Final   Staphylococcus species DETECTED (A) NOT DETECTED Final    Comment: CRITICAL RESULT CALLED TO, READ BACK BY AND VERIFIED WITH: PHARMD KAREN A. [id number] [id number] FCP    Staphylococcus aureus (BCID) NOT DETECTED NOT DETECTED Final   Staphylococcus epidermidis DETECTED (A) NOT DETECTED Final    Comment: Methicillin (oxacillin) resistant coagulase negative staphylococcus. Possible blood culture contaminant (unless isolated from more than one blood culture draw or clinical case suggests pathogenicity). No antibiotic treatment is indicated for blood  culture contaminants. CRITICAL RESULT CALLED TO, READ BACK BY AND VERIFIED  WITH: PHARMD KAREN A. 0221 [id number] FCP    Staphylococcus lugdunensis NOT DETECTED NOT DETECTED Final   Streptococcus species NOT DETECTED NOT DETECTED Final   Streptococcus agalactiae NOT DETECTED NOT DETECTED Final   Streptococcus pneumoniae NOT DETECTED NOT DETECTED Final   Streptococcus pyogenes NOT DETECTED NOT DETECTED Final   A.calcoaceticus-baumannii NOT DETECTED NOT DETECTED Final   Bacteroides fragilis NOT DETECTED NOT DETECTED Final   Enterobacterales NOT DETECTED NOT DETECTED Final   Enterobacter cloacae complex NOT DETECTED NOT DETECTED Final   Escherichia coli NOT DETECTED NOT DETECTED Final   Klebsiella aerogenes NOT DETECTED NOT DETECTED Final   Klebsiella oxytoca NOT DETECTED NOT DETECTED Final   Klebsiella pneumoniae NOT DETECTED NOT DETECTED Final   Proteus species NOT DETECTED NOT DETECTED Final   Salmonella species NOT DETECTED NOT DETECTED Final   Serratia marcescens NOT DETECTED NOT DETECTED Final   Haemophilus influenzae NOT DETECTED NOT DETECTED Final   Neisseria meningitidis NOT DETECTED NOT DETECTED Final   Pseudomonas aeruginosa NOT DETECTED NOT DETECTED Final    Stenotrophomonas maltophilia NOT DETECTED NOT DETECTED Final   Candida albicans NOT DETECTED NOT DETECTED Final   Candida auris NOT DETECTED NOT DETECTED Final   Candida glabrata NOT DETECTED NOT DETECTED Final   Candida krusei NOT DETECTED NOT DETECTED Final   Candida parapsilosis NOT DETECTED NOT DETECTED Final   Candida tropicalis NOT DETECTED NOT DETECTED Final   Cryptococcus neoformans/gattii NOT DETECTED NOT DETECTED Final   Methicillin resistance mecA/C DETECTED (A) NOT DETECTED Final    Comment: CRITICAL RESULT CALLED TO, READ BACK BY AND VERIFIED WITH: Ronney Lion [id number] FCP Performed at [facility], [address], [place], [place] [address]   Culture, blood (routine x 2)     Status: None (Preliminary result)   Collection Time: 12/17/20 12:37 PM   Specimen: BLOOD  Result Value Ref Range Status   Specimen Description BLOOD RIGHT ANTECUBITAL  Final   Special Requests   Final    BOTTLES DRAWN AEROBIC ONLY Blood Culture results may not be optimal due to an inadequate volume of blood received in culture bottles   Culture   Final    NO GROWTH < 24 HOURS Performed at [facility], [address], [place], [place] [address]    Report Status PENDING  Incomplete  Culture, blood (routine x 2)     Status: None (Preliminary result)   Collection Time: 12/17/20 12:38 PM   Specimen: BLOOD RIGHT HAND  Result Value Ref Range Status   Specimen Description BLOOD RIGHT HAND  Final   Special Requests   Final    BOTTLES DRAWN AEROBIC ONLY Blood Culture adequate volume   Culture   Final    NO GROWTH < 24 HOURS Performed at [facility], [address], [place], [place] [address]    Report Status PENDING  Incomplete    Radiology Reports DG Chest Port 1 View  Result Date: 12/16/2020 CLINICAL DATA:  Questionable sepsis. EXAM: PORTABLE CHEST 1 VIEW COMPARISON:  11/15/2009. FINDINGS: Tracheostomy tube noted with tip over the trachea. Heart size normal. Bibasilar  atelectasis. Left base infiltrate suggesting pneumonia noted. No pleural effusion or pneumothorax. Thoracic spine scoliosis with thoracolumbar spine fusion. Hardware intact. IMPRESSION: 1.  Tracheostomy tube in good anatomic position. 2. Bibasilar atelectasis. Left base infiltrate consistent pneumonia. Electronically Signed   By: Maisie Fus  Register   On: 12/16/2020 05:26

## 2020-12-18 NOTE — Plan of Care (Signed)
  Problem: Clinical Measurements: Goal: Diagnostic test results will improve Outcome: Progressing   Problem: Elimination: Goal: Will not experience complications related to bowel motility Outcome: Progressing   Problem: Safety: Goal: Ability to remain free from injury will improve Outcome: Progressing   Problem: Skin Integrity: Goal: Risk for impaired skin integrity will decrease Outcome: Progressing

## 2020-12-19 LAB — CBC WITH DIFFERENTIAL/PLATELET
Abs Immature Granulocytes: 0.01 10*3/uL (ref 0.00–0.07)
Basophils Absolute: 0 10*3/uL (ref 0.0–0.1)
Basophils Relative: 0 %
Eosinophils Absolute: 0.1 10*3/uL (ref 0.0–0.5)
Eosinophils Relative: 2 %
HCT: 44.4 % (ref 39.0–52.0)
Hemoglobin: 14.5 g/dL (ref 13.0–17.0)
Immature Granulocytes: 0 %
Lymphocytes Relative: 24 %
Lymphs Abs: 1.1 10*3/uL (ref 0.7–4.0)
MCH: 29.4 pg (ref 26.0–34.0)
MCHC: 32.7 g/dL (ref 30.0–36.0)
MCV: 89.9 fL (ref 80.0–100.0)
Monocytes Absolute: 0.7 10*3/uL (ref 0.1–1.0)
Monocytes Relative: 16 %
Neutro Abs: 2.6 10*3/uL (ref 1.7–7.7)
Neutrophils Relative %: 58 %
Platelets: 271 10*3/uL (ref 150–400)
RBC: 4.94 MIL/uL (ref 4.22–5.81)
RDW: 14.7 % (ref 11.5–15.5)
WBC: 4.6 10*3/uL (ref 4.0–10.5)
nRBC: 0 % (ref 0.0–0.2)

## 2020-12-19 LAB — FERRITIN: Ferritin: 81 ng/mL (ref 24–336)

## 2020-12-19 LAB — CULTURE, BLOOD (SINGLE): Special Requests: ADEQUATE

## 2020-12-19 LAB — COMPREHENSIVE METABOLIC PANEL
ALT: 26 U/L (ref 0–44)
AST: 29 U/L (ref 15–41)
Albumin: 4.1 g/dL (ref 3.5–5.0)
Alkaline Phosphatase: 101 U/L (ref 38–126)
Anion gap: 12 (ref 5–15)
BUN: 7 mg/dL (ref 6–20)
CO2: 32 mmol/L (ref 22–32)
Calcium: 9.6 mg/dL (ref 8.9–10.3)
Chloride: 95 mmol/L — ABNORMAL LOW (ref 98–111)
Creatinine, Ser: <0.3 mg/dL — ABNORMAL LOW (ref 0.61–1.24)
Glucose, Bld: 109 mg/dL — ABNORMAL HIGH (ref 70–99)
Potassium: 4.3 mmol/L (ref 3.5–5.1)
Sodium: 139 mmol/L (ref 135–145)
Total Bilirubin: 0.6 mg/dL (ref 0.3–1.2)
Total Protein: 8 g/dL (ref 6.5–8.1)

## 2020-12-19 LAB — PHOSPHORUS: Phosphorus: 4.4 mg/dL (ref 2.5–4.6)

## 2020-12-19 LAB — D-DIMER, QUANTITATIVE: D-Dimer, Quant: 0.4 ug/mL-FEU (ref 0.00–0.50)

## 2020-12-19 LAB — MAGNESIUM: Magnesium: 2 mg/dL (ref 1.7–2.4)

## 2020-12-19 LAB — C-REACTIVE PROTEIN: CRP: 5.1 mg/dL — ABNORMAL HIGH (ref <1.0)

## 2020-12-19 MED ORDER — PREDNISONE INTENSOL 5 MG/ML PO CONC: ORAL | 0 refills | Status: AC

## 2020-12-19 NOTE — Plan of Care (Signed)
  Problem: Education: Goal: Knowledge of General Education information will improve Description: Including pain rating scale, medication(s)/side effects and non-pharmacologic comfort measures Outcome: Progressing   Problem: Health Behavior/Discharge Planning: Goal: Ability to manage health-related needs will improve Outcome: Progressing   Problem: Elimination: Goal: Will not experience complications related to bowel motility Outcome: Progressing   Problem: Pain Managment: Goal: General experience of comfort will improve Outcome: Progressing   Problem: Safety: Goal: Ability to remain free from injury will improve Outcome: Progressing   Problem: Skin Integrity: Goal: Risk for impaired skin integrity will decrease Outcome: Progressing   

## 2020-12-19 NOTE — Discharge Summary (Signed)
PATIENT DETAILS Name: Bryan [name] Age: 25 y.o. Sex: male Date of Birth: 1996/05/23 MRN: [id number]. Admitting Physician: Jonah Blue, MD WUJ:[id number], [place]  Admit Date: 12/16/2020 Discharge date: 12/19/2020  Recommendations for Outpatient Follow-up:  Follow up with PCP in 1-2 weeks Please obtain CMP/CBC in one week Repeat Chest Xray in 4-6 week   Admitted From:  Home   Disposition: Home with home health services   Home Health: Family indicates-they will resume prior home health services.  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  Once at home-he developed a further worsening hypoxemia-in was brought to the ED for further evaluation and treatment.  See below for further details.  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  Treated with Remdesivir/steroids/Unasyn.  Clinically improved-per mother-he is now back to his baseline-and she feels very comfortable taking him home.  CRP has down trended as well-plans are to continue tapering steroids on discharge.  COVID-19 Labs:  Recent Labs    12/16/20 1527 12/17/20 0130 12/17/20 0626 12/18/20 0221 12/19/20 0202  DDIMER 1.52* 0.93*  --   0.48 0.40  FERRITIN 106  --  95 90 81  LDH 163  --   --   --   --   CRP 20.9*  --  16.7* 10.5* 5.1*    No results found for: SARSCOV2NAA   Gram-positive bacteremia: Has a blood cultures at [facility] here grown coag negative staph-however cultures at [facility] was also positive for Enterococcus faecalis.  Patient has extensive hardware in his spine-and has stage IV sacral decubitus ulcer.  Initially treated with IV vancomycin-subsequently evaluated by infectious disease-and given 1 dose of oritavancin.  Repeat blood cultures are negative so far-please continue to follow until final.  Stage IV sacral decubitus ulcer: Chronic issue-continue wound care follow-up at the wound care clinic.  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  RN pressure injury documentation: Pressure Injury 12/16/20 Buttocks Stage 2 -  Partial thickness loss of dermis presenting as a shallow open injury with a red, pink wound bed without slough. (Active)  12/16/20 1530  Location: Buttocks  Location Orientation:   Staging: Stage 2 -  Partial thickness loss of dermis presenting as a shallow open injury with a red, pink wound bed without slough.  Wound Description (Comments):   Present on Admission: Yes      Discharge Diagnoses:  Principal Problem:   COVID-19 Active Problems:   Cerebral palsy, quadriplegic (HCC)   Tracheostomy present (HCC)   Gastrointestinal tube present (HCC)   Pressure ulcer of coccygeal region, stage III [facility])   Discharge Instructions:    Person Under Monitoring Name:  Bryan [name]  Location: [address] [place] [id number]   Infection Prevention Recommendations for Individuals Confirmed to have, or Being Evaluated for, 2019 Novel Coronavirus (COVID-19) Infection Who Receive Care at  Home  Individuals who are confirmed to have, or are being evaluated for, COVID-19 should follow the prevention steps below until a healthcare provider or local or state health department says they can return to normal activities.  Stay home except to get medical care You should restrict activities outside your home, except for getting medical care. Do not go to work, school, or public areas, and do not use public transportation or taxis.  Call ahead before visiting your doctor Before your medical appointment, call the healthcare provider and tell them that you have, or are being evaluated for, COVID-19 infection. This will help the healthcare provider's office take steps to keep other people from getting infected. Ask your healthcare provider to call the local or state health department.  Monitor your symptoms Seek prompt medical attention if your illness is worsening (e.g., difficulty breathing). Before going to your medical appointment, call the healthcare provider and tell them that you have, or are being evaluated for, COVID-19 infection. Ask your healthcare provider to call the local or state health department.  Wear a facemask You should wear a facemask that covers your nose and mouth when you are in the same room with other people and when you visit a healthcare provider. People who live with or visit you should also wear a facemask while they are in the same room with you.  Separate yourself from other people in your home As much as possible, you should stay in a different room from other people in your home. Also, you should use a separate bathroom, if available.  Avoid sharing household items You should not share dishes, drinking glasses, cups, eating utensils, towels, bedding, or other items with other people in your home. After using these items, you should wash them thoroughly with soap and water.  Cover your coughs and sneezes Cover your mouth and nose with a tissue  when you cough or sneeze, or you can cough or sneeze into your sleeve. Throw used tissues in a lined trash can, and immediately wash your hands with soap and water for at least 20 seconds or use an alcohol-based hand rub.  Wash your [employer] Pacific Corporationhands Wash your hands often and thoroughly with soap and water for at least 20 seconds. You can use an alcohol-based hand sanitizer if soap and water are not available and if your hands are not visibly dirty. Avoid touching your eyes, nose, and mouth with unwashed hands.   Prevention Steps for Caregivers and Household Members of Individuals Confirmed to have, or Being Evaluated for, COVID-19 Infection Being Cared for in the Home  If you live with, or provide care at home for, a person confirmed to have, or being evaluated for, COVID-19 infection please follow these guidelines to prevent infection:  Follow healthcare provider's instructions Make sure that you understand and can help the patient follow any healthcare provider instructions for all care.  Provide for the patient's basic needs You should help the patient with basic needs in the home and provide support for getting groceries, prescriptions, and other personal needs.  Monitor the patient's symptoms If they are getting sicker, call his or her medical provider and tell them that the patient has, or is being evaluated for, COVID-19 infection. This will help the healthcare provider's office take steps to  keep other people from getting infected. Ask the healthcare provider to call the local or state health department.  Limit the number of people who have contact with the patient If possible, have only one caregiver for the patient. Other household members should stay in another home or place of residence. If this is not possible, they should stay in another room, or be separated from the patient as much as possible. Use a separate bathroom, if available. Restrict visitors who do not have an essential  need to be in the home.  Keep older adults, very young children, and other sick people away from the patient Keep older adults, very young children, and those who have compromised immune systems or chronic health conditions away from the patient. This includes people with chronic heart, lung, or kidney conditions, diabetes, and cancer.  Ensure good ventilation Make sure that shared spaces in the home have good air flow, such as from an air conditioner or an opened window, weather permitting.  Wash your hands often Wash your hands often and thoroughly with soap and water for at least 20 seconds. You can use an alcohol based hand sanitizer if soap and water are not available and if your hands are not visibly dirty. Avoid touching your eyes, nose, and mouth with unwashed hands. Use disposable paper towels to dry your hands. If not available, use dedicated cloth towels and replace them when they become wet.  Wear a facemask and gloves Wear a disposable facemask at all times in the room and gloves when you touch or have contact with the patient's blood, body fluids, and/or secretions or excretions, such as sweat, saliva, sputum, nasal mucus, vomit, urine, or feces.  Ensure the mask fits over your nose and mouth tightly, and do not touch it during use. Throw out disposable facemasks and gloves after using them. Do not reuse. Wash your hands immediately after removing your facemask and gloves. If your personal clothing becomes contaminated, carefully remove clothing and launder. Wash your hands after handling contaminated clothing. Place all used disposable facemasks, gloves, and other waste in a lined container before disposing them with other household waste. Remove gloves and wash your hands immediately after handling these items.  Do not share dishes, glasses, or other household items with the patient Avoid sharing household items. You should not share dishes, drinking glasses, cups, eating  utensils, towels, bedding, or other items with a patient who is confirmed to have, or being evaluated for, COVID-19 infection. After the person uses these items, you should wash them thoroughly with soap and water.  Wash laundry thoroughly Immediately remove and wash clothes or bedding that have blood, body fluids, and/or secretions or excretions, such as sweat, saliva, sputum, nasal mucus, vomit, urine, or feces, on them. Wear gloves when handling laundry from the patient. Read and follow directions on labels of laundry or clothing items and detergent. In general, wash and dry with the warmest temperatures recommended on the label.  Clean all areas the individual has used often Clean all touchable surfaces, such as counters, tabletops, doorknobs, bathroom fixtures, toilets, phones, keyboards, tablets, and bedside tables, every day. Also, clean any surfaces that may have blood, body fluids, and/or secretions or excretions on them. Wear gloves when cleaning surfaces the patient has come in contact with. Use a diluted bleach solution (e.g., dilute bleach with 1 part bleach and 10 parts water) or a household disinfectant with a label that says EPA-registered for coronaviruses. To make a bleach solution at home,  add 1 tablespoon of bleach to 1 quart (4 cups) of water. For a larger supply, add  cup of bleach to 1 gallon (16 cups) of water. Read labels of cleaning products and follow recommendations provided on product labels. Labels contain instructions for safe and effective use of the cleaning product including precautions you should take when applying the product, such as wearing gloves or eye protection and making sure you have good ventilation during use of the product. Remove gloves and wash hands immediately after cleaning.  Monitor yourself for signs and symptoms of illness Caregivers and household members are considered close contacts, should monitor their health, and will be asked to  limit movement outside of the home to the extent possible. Follow the monitoring steps for close contacts listed on the symptom monitoring form.   ? If you have additional questions, contact your local health department or call the epidemiologist on call at [phone] (available 24/7). ? This guidance is subject to change. For the most up-to-date guidance from CDC, please refer to their website: TripMetro.hu    Activity:  As tolerated    Discharge Instructions     Call MD for:  difficulty breathing, headache or visual disturbances   Complete by: As directed    Diet - low sodium heart healthy   Complete by: As directed    Discharge instructions   Complete by: As directed    Follow with Primary MD  Pediatrics, [place] in 1-2 weeks  Please get a complete blood count and chemistry panel checked by your Primary MD at your next visit, and again as instructed by your Primary MD.  Get Medicines reviewed and adjusted: Please take all your medications with you for your next visit with your Primary MD  Laboratory/radiological data: Please request your Primary MD to go over all hospital tests and procedure/radiological results at the follow up, please ask your Primary MD to get all Hospital records sent to his/her office.  In some cases, they will be blood work, cultures and biopsy results pending at the time of your discharge. Please request that your primary care M.D. follows up on these results.  Also Note the following: If you experience worsening of your admission symptoms, develop shortness of breath, life threatening emergency, suicidal or homicidal thoughts you must seek medical attention immediately by calling 911 or calling your MD immediately  if symptoms less severe.  You must read complete instructions/literature along with all the possible adverse reactions/side effects for all the Medicines you take  and that have been prescribed to you. Take any new Medicines after you have completely understood and accpet all the possible adverse reactions/side effects.   Do not drive when taking Pain medications or sleeping medications (Benzodaizepines)  Do not take more than prescribed Pain, Sleep and Anxiety Medications. It is not advisable to combine anxiety,sleep and pain medications without talking with your primary care practitioner  Special Instructions: If you have smoked or chewed Tobacco  in the last 2 yrs please stop smoking, stop any regular Alcohol  and or any Recreational drug use.  Wear Seat belts while driving.  Please note: You were cared for by a hospitalist during your hospital stay. Once you are discharged, your primary care physician will handle any further medical issues. Please note that NO REFILLS for any discharge medications will be authorized once you are discharged, as it is imperative that you return to your primary care physician (or establish a relationship with a primary care physician if you  do not have one) for your post hospital discharge needs so that they can reassess your need for medications and monitor your lab values.   1.)  Needs 21 days of isolation from the day of first positive COVID test or from the first day of your symptoms.   Discharge wound care:   Complete by: As directed    Cleanse sacral wound with NS and pat dry. Gently pack wound with thin strip of Aquacel Ag, leaving a tail protruding.  Cover with silicone foam.  Change daily. Gerhardts butt paste to scrotum and buttocks breakdown twice daily and PRN soilage   Increase activity slowly   Complete by: As directed       Allergies as of 12/19/2020       Reactions   Naproxen Other (See Comments)   Cannot have due to illness   Other Rash   Peach colored rubber tape: Caused rash (childhood allergy)         Medication List     STOP taking these medications    cyanocobalamin 1000 MCG/ML  injection Commonly known as: (VITAMIN B-12)   doxycycline 25 MG/5ML Susr Commonly known as: VIBRAMYCIN       TAKE these medications    acetaminophen 325 MG tablet Commonly known as: TYLENOL Take 650 mg by mouth every 6 (six) hours as needed for mild pain or fever.   acyclovir 200 MG/5ML suspension Commonly known as: ZOVIRAX Place 800 mg into feeding tube as needed (outbreak).   albuterol 0.63 MG/3ML nebulizer solution Commonly known as: ACCUNEB Inhale 1 ampule into the lungs every 6 (six) hours as needed for wheezing or shortness of breath.   bethanechol 1 mg/mL Susp Commonly known as: URECHOLINE Place 3.75 mLs into feeding tube in the morning and at bedtime.   budesonide 0.5 MG/2ML nebulizer solution Commonly known as: PULMICORT Inhale 0.5 mg into the lungs in the morning and at bedtime.   cetirizine HCl 5 MG/5ML Soln Commonly known as: Zyrtec 10 mg by Per J Tube route daily.   Critic-Aid Thick Moist Barrier Pste Apply 1 application topically 2 (two) times daily as needed for rash.   cyproheptadine 2 MG/5ML syrup Commonly known as: PERIACTIN 2 mg by Per J Tube route in the morning and at bedtime.   diazepam 1 MG/ML solution Commonly known as: VALIUM 5 mg by Per J Tube route 2 (two) times daily as needed for muscle pain or anxiety.   fluticasone 50 MCG/ACT nasal spray Commonly known as: FLONASE Place 1-2 sprays into both nostrils daily.   lansoprazole 30 MG disintegrating tablet Commonly known as: PREVACID SOLUTAB 30 mg by Per J Tube route daily.   levETIRAcetam 100 MG/ML solution Commonly known as: KEPPRA 1,000 mg by Per J Tube route in the morning and at bedtime.   mupirocin ointment 2 % Commonly known as: BACTROBAN Apply 1 application topically 3 (three) times daily as needed (skin infection/irritation).   nystatin powder Commonly known as: MYCOSTATIN/NYSTOP Apply 1 application topically daily as needed (skin irritation).   PHENObarbital 20 MG/5ML  elixir 10.125 mLs by Per J Tube route at bedtime.   polyethylene glycol powder 17 GM/SCOOP powder Commonly known as: GLYCOLAX/MIRALAX 17 g by Per J Tube route daily as needed for constipation.   predniSONE Intensol 5 MG/ML concentrated solution Generic drug: predniSONE Take 40 mg via tube daily for 1 day, then 30 mg via tube for 1 day, then 20 mg via tube for 1 day, then 10 mg via tube  for 1 day and stop.   Super Daily D3 25 MCG /0.028ML Liqd Generic drug: Cholecalciferol 4,000 Units by Per J Tube route daily.               Discharge Care Instructions  (From admission, onward)           Start     Ordered   12/19/20 0000  Discharge wound care:       Comments: Cleanse sacral wound with NS and pat dry. Gently pack wound with thin strip of Aquacel Ag, leaving a tail protruding.  Cover with silicone foam.  Change daily. Gerhardts butt paste to scrotum and buttocks breakdown twice daily and PRN soilage   12/19/20 0901            Follow-up Information     P[facility], [place]. Schedule an appointment as soon as possible for a visit in 1 week(s).   Specialty: Pediatrics Why: Hospital follow up Contact information: [address] [place] [address] [phone]                Allergies  Allergen Reactions   Naproxen Other (See Comments)    Cannot have due to illness   Other Rash    Peach colored rubber tape: Caused rash (childhood allergy)       Consultations:  ID    Other Procedures/Studies: DG Chest Port 1 View  Result Date: 12/16/2020 CLINICAL DATA:  Questionable sepsis. EXAM: PORTABLE CHEST 1 VIEW COMPARISON:  11/15/2009. FINDINGS: Tracheostomy tube noted with tip over the trachea. Heart size normal. Bibasilar atelectasis. Left base infiltrate suggesting pneumonia noted. No pleural effusion or pneumothorax. Thoracic spine scoliosis with thoracolumbar spine fusion. Hardware intact. IMPRESSION: 1.  Tracheostomy tube in good anatomic  position. 2. Bibasilar atelectasis. Left base infiltrate consistent pneumonia. Electronically Signed   By: Maisie Fus  Register   On: 12/16/2020 05:26     TODAY-DAY OF DISCHARGE:  Subjective:   Bryan [name] today has had no major issues overnight.  This MD spoke with patient's mother over the phone-she had spent the night with the patient-she is very familiar with the patient's medical issues and is the patient's primary caregiver-Per patient's mother-patient is now back at his baseline and she feels very comfortable taking him home.  Objective:   Blood pressure (!) 129/96, pulse 100, temperature 98.8 F (37.1 C), temperature source Axillary, resp. rate 14, height  (1.6 m), weight 44.3 kg, SpO2 99 %.  Intake/Output Summary (Last 24 hours) at 12/19/2020 0906 Last data filed at 12/19/2020 0313 Gross per 24 hour  Intake 150.06 ml  Output 2500 ml  Net -2349.94 ml   Filed Weights   12/17/20 0300  Weight: 44.3 kg    Exam: Awake-not in any distress Chest: Clear to auscultation anteriorly Abdomen: Slightly distended-midline scar-soft-nontender Extremities: No edema with contracted Neurology: Spastic quadriparesis at baseline   PERTINENT RADIOLOGIC STUDIES: DG Chest Port 1 View  Result Date: 12/16/2020 CLINICAL DATA:  Questionable sepsis. EXAM: PORTABLE CHEST 1 VIEW COMPARISON:  11/15/2009. FINDINGS: Tracheostomy tube noted with tip over the trachea. Heart size normal. Bibasilar atelectasis. Left base infiltrate suggesting pneumonia noted. No pleural effusion or pneumothorax. Thoracic spine scoliosis with thoracolumbar spine fusion. Hardware intact. IMPRESSION: 1.  Tracheostomy tube in good anatomic position. 2. Bibasilar atelectasis. Left base infiltrate consistent pneumonia. Electronically Signed   By: Maisie Fus  Register   On: 12/16/2020 05:26     PERTINENT LAB RESULTS: CBC: Recent Labs    12/18/20 0221 12/19/20 0202  WBC  3.5* 4.6  HGB 14.5 14.5  HCT 45.3 44.4  PLT 212  271   CMET CMP     Component Value Date/Time   NA 139 12/19/2020 0202   K 4.3 12/19/2020 0202   CL 95 (L) 12/19/2020 0202   CO2 32 12/19/2020 0202   GLUCOSE 109 (H) 12/19/2020 0202   BUN 7 12/19/2020 0202   CREATININE <0.30 (L) 12/19/2020 0202   CALCIUM 9.6 12/19/2020 0202   PROT 8.0 12/19/2020 0202   ALBUMIN 4.1 12/19/2020 0202   AST 29 12/19/2020 0202   ALT 26 12/19/2020 0202   ALKPHOS 101 12/19/2020 0202   BILITOT 0.6 12/19/2020 0202   GFRNONAA NOT CALCULATED 12/19/2020 0202    GFR CrCl cannot be calculated (This lab value cannot be used to calculate CrCl because it is not a number: <0.30). No results for input(s): LIPASE, AMYLASE in the last 72 hours. No results for input(s): CKTOTAL, CKMB, CKMBINDEX, TROPONINI in the last 72 hours. Invalid input(s): POCBNP Recent Labs    12/18/20 0221 12/19/20 0202  DDIMER 0.48 0.40   No results for input(s): HGBA1C in the last 72 hours. No results for input(s): CHOL, HDL, LDLCALC, TRIG, CHOLHDL, LDLDIRECT in the last 72 hours. No results for input(s): TSH, T4TOTAL, T3FREE, THYROIDAB in the last 72 hours.  Invalid input(s): FREET3 Recent Labs    12/18/20 0221 12/19/20 0202  FERRITIN 90 81   Coags: No results for input(s): INR in the last 72 hours.  Invalid input(s): PT Microbiology: Recent Results (from the past 240 hour(s))  Blood culture (routine single)     Status: Abnormal (Preliminary result)   Collection Time: 12/16/20  5:12 AM   Specimen: BLOOD RIGHT WRIST  Result Value Ref Range Status   Specimen Description BLOOD RIGHT WRIST  Final   Special Requests   Final    BOTTLES DRAWN AEROBIC AND ANAEROBIC Blood Culture adequate volume   Culture  Setup Time   Final    GRAM POSITIVE COCCI IN CLUSTERS IN BOTH AEROBIC AND ANAEROBIC BOTTLES CRITICAL RESULT CALLED TO, READ BACK BY AND VERIFIED WITH: PHARMD KAREN A. [id number] FCP    Culture (A)  Final    STAPHYLOCOCCUS HOMINIS STAPHYLOCOCCUS EPIDERMIDIS THE  SIGNIFICANCE OF ISOLATING THIS ORGANISM FROM A SINGLE SET OF BLOOD CULTURES WHEN MULTIPLE SETS ARE DRAWN IS UNCERTAIN. PLEASE NOTIFY THE MICROBIOLOGY DEPARTMENT WITHIN ONE WEEK IF SPECIATION AND SENSITIVITIES ARE REQUIRED. Performed at [facility], [address], [place], [place] [address]    Report Status PENDING  Incomplete  Blood Culture ID Panel (Reflexed)     Status: Abnormal   Collection Time: 12/16/20  5:12 AM  Result Value Ref Range Status   Enterococcus faecalis NOT DETECTED NOT DETECTED Final   Enterococcus Faecium NOT DETECTED NOT DETECTED Final   Listeria monocytogenes NOT DETECTED NOT DETECTED Final   Staphylococcus species DETECTED (A) NOT DETECTED Final    Comment: CRITICAL RESULT CALLED TO, READ BACK BY AND VERIFIED WITH: PHARMD KAREN A. [phone] FCP    Staphylococcus aureus (BCID) NOT DETECTED NOT DETECTED Final   Staphylococcus epidermidis DETECTED (A) NOT DETECTED Final    Comment: Methicillin (oxacillin) resistant coagulase negative staphylococcus. Possible blood culture contaminant (unless isolated from more than one blood culture draw or clinical case suggests pathogenicity). No antibiotic treatment is indicated for blood  culture contaminants. CRITICAL RESULT CALLED TO, READ BACK BY AND VERIFIED WITH: PHARMD KAREN A. [id number] FCP    Staphylococcus lugdunensis NOT DETECTED NOT  DETECTED Final   Streptococcus species NOT DETECTED NOT DETECTED Final   Streptococcus agalactiae NOT DETECTED NOT DETECTED Final   Streptococcus pneumoniae NOT DETECTED NOT DETECTED Final   Streptococcus pyogenes NOT DETECTED NOT DETECTED Final   A.calcoaceticus-baumannii NOT DETECTED NOT DETECTED Final   Bacteroides fragilis NOT DETECTED NOT DETECTED Final   Enterobacterales NOT DETECTED NOT DETECTED Final   Enterobacter cloacae complex NOT DETECTED NOT DETECTED Final   Escherichia coli NOT DETECTED NOT DETECTED Final   Klebsiella aerogenes NOT DETECTED NOT DETECTED Final    Klebsiella oxytoca NOT DETECTED NOT DETECTED Final   Klebsiella pneumoniae NOT DETECTED NOT DETECTED Final   Proteus species NOT DETECTED NOT DETECTED Final   Salmonella species NOT DETECTED NOT DETECTED Final   Serratia marcescens NOT DETECTED NOT DETECTED Final   Haemophilus influenzae NOT DETECTED NOT DETECTED Final   Neisseria meningitidis NOT DETECTED NOT DETECTED Final   Pseudomonas aeruginosa NOT DETECTED NOT DETECTED Final   Stenotrophomonas maltophilia NOT DETECTED NOT DETECTED Final   Candida albicans NOT DETECTED NOT DETECTED Final   Candida auris NOT DETECTED NOT DETECTED Final   Candida glabrata NOT DETECTED NOT DETECTED Final   Candida krusei NOT DETECTED NOT DETECTED Final   Candida parapsilosis NOT DETECTED NOT DETECTED Final   Candida tropicalis NOT DETECTED NOT DETECTED Final   Cryptococcus neoformans/gattii NOT DETECTED NOT DETECTED Final   Methicillin resistance mecA/C DETECTED (A) NOT DETECTED Final    Comment: CRITICAL RESULT CALLED TO, READ BACK BY AND VERIFIED WITH: Ronney Lion [id number] FCP Performed at [facility], [address], [place], [place] [address]   Culture, blood (routine x 2)     Status: None (Preliminary result)   Collection Time: 12/17/20 12:37 PM   Specimen: BLOOD  Result Value Ref Range Status   Specimen Description BLOOD RIGHT ANTECUBITAL  Final   Special Requests   Final    BOTTLES DRAWN AEROBIC ONLY Blood Culture results may not be optimal due to an inadequate volume of blood received in culture bottles   Culture   Final    NO GROWTH 2 DAYS Performed at [facility], [address] [place], [place] [address]    Report Status PENDING  Incomplete  Culture, blood (routine x 2)     Status: None (Preliminary result)   Collection Time: 12/17/20 12:38 PM   Specimen: BLOOD RIGHT HAND  Result Value Ref Range Status   Specimen Description BLOOD RIGHT HAND  Final   Special Requests   Final    BOTTLES DRAWN AEROBIC ONLY Blood  Culture adequate volume   Culture   Final    NO GROWTH 2 DAYS Performed at [facility], [address] [place], [place] [address]    Report Status PENDING  Incomplete    FURTHER DISCHARGE INSTRUCTIONS:  Get Medicines reviewed and adjusted: Please take all your medications with you for your next visit with your Primary MD  Laboratory/radiological data: Please request your Primary MD to go over all hospital tests and procedure/radiological results at the follow up, please ask your Primary MD to get all Hospital records sent to his/her office.  In some cases, they will be blood work, cultures and biopsy results pending at the time of your discharge. Please request that your primary care M.D. goes through all the records of your hospital data and follows up on these results.  Also Note the following: If you experience worsening of your admission symptoms, develop shortness  of breath, life threatening emergency, suicidal or homicidal thoughts you must seek medical attention immediately by calling 911 or calling your MD immediately  if symptoms less severe.  You must read complete instructions/literature along with all the possible adverse reactions/side effects for all the Medicines you take and that have been prescribed to you. Take any new Medicines after you have completely understood and accpet all the possible adverse reactions/side effects.   Do not drive when taking Pain medications or sleeping medications (Benzodaizepines)  Do not take more than prescribed Pain, Sleep and Anxiety Medications. It is not advisable to combine anxiety,sleep and pain medications without talking with your primary care practitioner  Special Instructions: If you have smoked or chewed Tobacco  in the last 2 yrs please stop smoking, stop any regular Alcohol  and or any Recreational drug use.  Wear Seat belts while driving.  Please note: You were cared for by a hospitalist during your hospital stay.  Once you are discharged, your primary care physician will handle any further medical issues. Please note that NO REFILLS for any discharge medications will be authorized once you are discharged, as it is imperative that you return to your primary care physician (or establish a relationship with a primary care physician if you do not have one) for your post hospital discharge needs so that they can reassess your need for medications and monitor your lab values.  Total Time spent coordinating discharge including counseling, education and face to face time equals 35 minutes.  SignedJeoffrey Massed 12/19/2020 9:06 AM

## 2020-12-19 NOTE — TOC Transition Note (Addendum)
Transition of Care [facility]) - CM/SW Discharge Note   Patient Details  Name: Bryan [name] MRN: [id number] Date of Birth: [birth year]  Transition of Care [facility]) CM/SW Contact:  Lawerance Sabal, RN Phone Number: 12/19/2020, 9:33 AM   Clinical Narrative:    Sherron Monday w patient's mother Bryan [name]. She will provide transport home. There are no new DME needs. He has oxygen through [employer]. He has nursing services through [facility]. Confirmed w [facility] liaison that he does not need resumption orders, liaison will inform branch of [place] to resume adult services.  Spoke w pharmacist at [facility], clarified to substitute concentration of prednisone liquid to that covered by Medicaid per verbal order Dr Jerral Ralph. It will change to 5mg /27ml. Spoke w mother, she understands to follow directions on bottle as prescribed.     Final next level of care: Home w Home Health Services Barriers to Discharge: No Barriers Identified   Patient Goals and CMS Choice Patient states their goals for this hospitalization and ongoing recovery are:: return home CMS Medicare.gov Compare Post Acute Care list provided to:: Other (Comment Required) Choice offered to / list presented to : Parent  Discharge Placement                       Discharge Plan and Services                DME Arranged: N/A           [facility] Agency: [facility] Date [facility] Agency Contacted: 12/19/20 Time HH Agency Contacted: [phone] Representative spoke with at [facility] Agency: [facility]  Social Determinants of Health (SDOH) Interventions     Readmission Risk Interventions No flowsheet data found.

## 2020-12-22 ENCOUNTER — Other Ambulatory Visit (HOSPITAL_COMMUNITY): Payer: Self-pay

## 2020-12-22 LAB — CULTURE, BLOOD (ROUTINE X 2)
Culture: NO GROWTH
Culture: NO GROWTH
Special Requests: ADEQUATE

## 2022-11-17 IMAGING — DX DG CHEST 1V PORT
1 series · 1 of 1 positions shown · non-contrast
Comparison: 11/15/2009.

CLINICAL DATA: Questionable sepsis.

EXAM:
PORTABLE CHEST 1 VIEW

[chest ap]
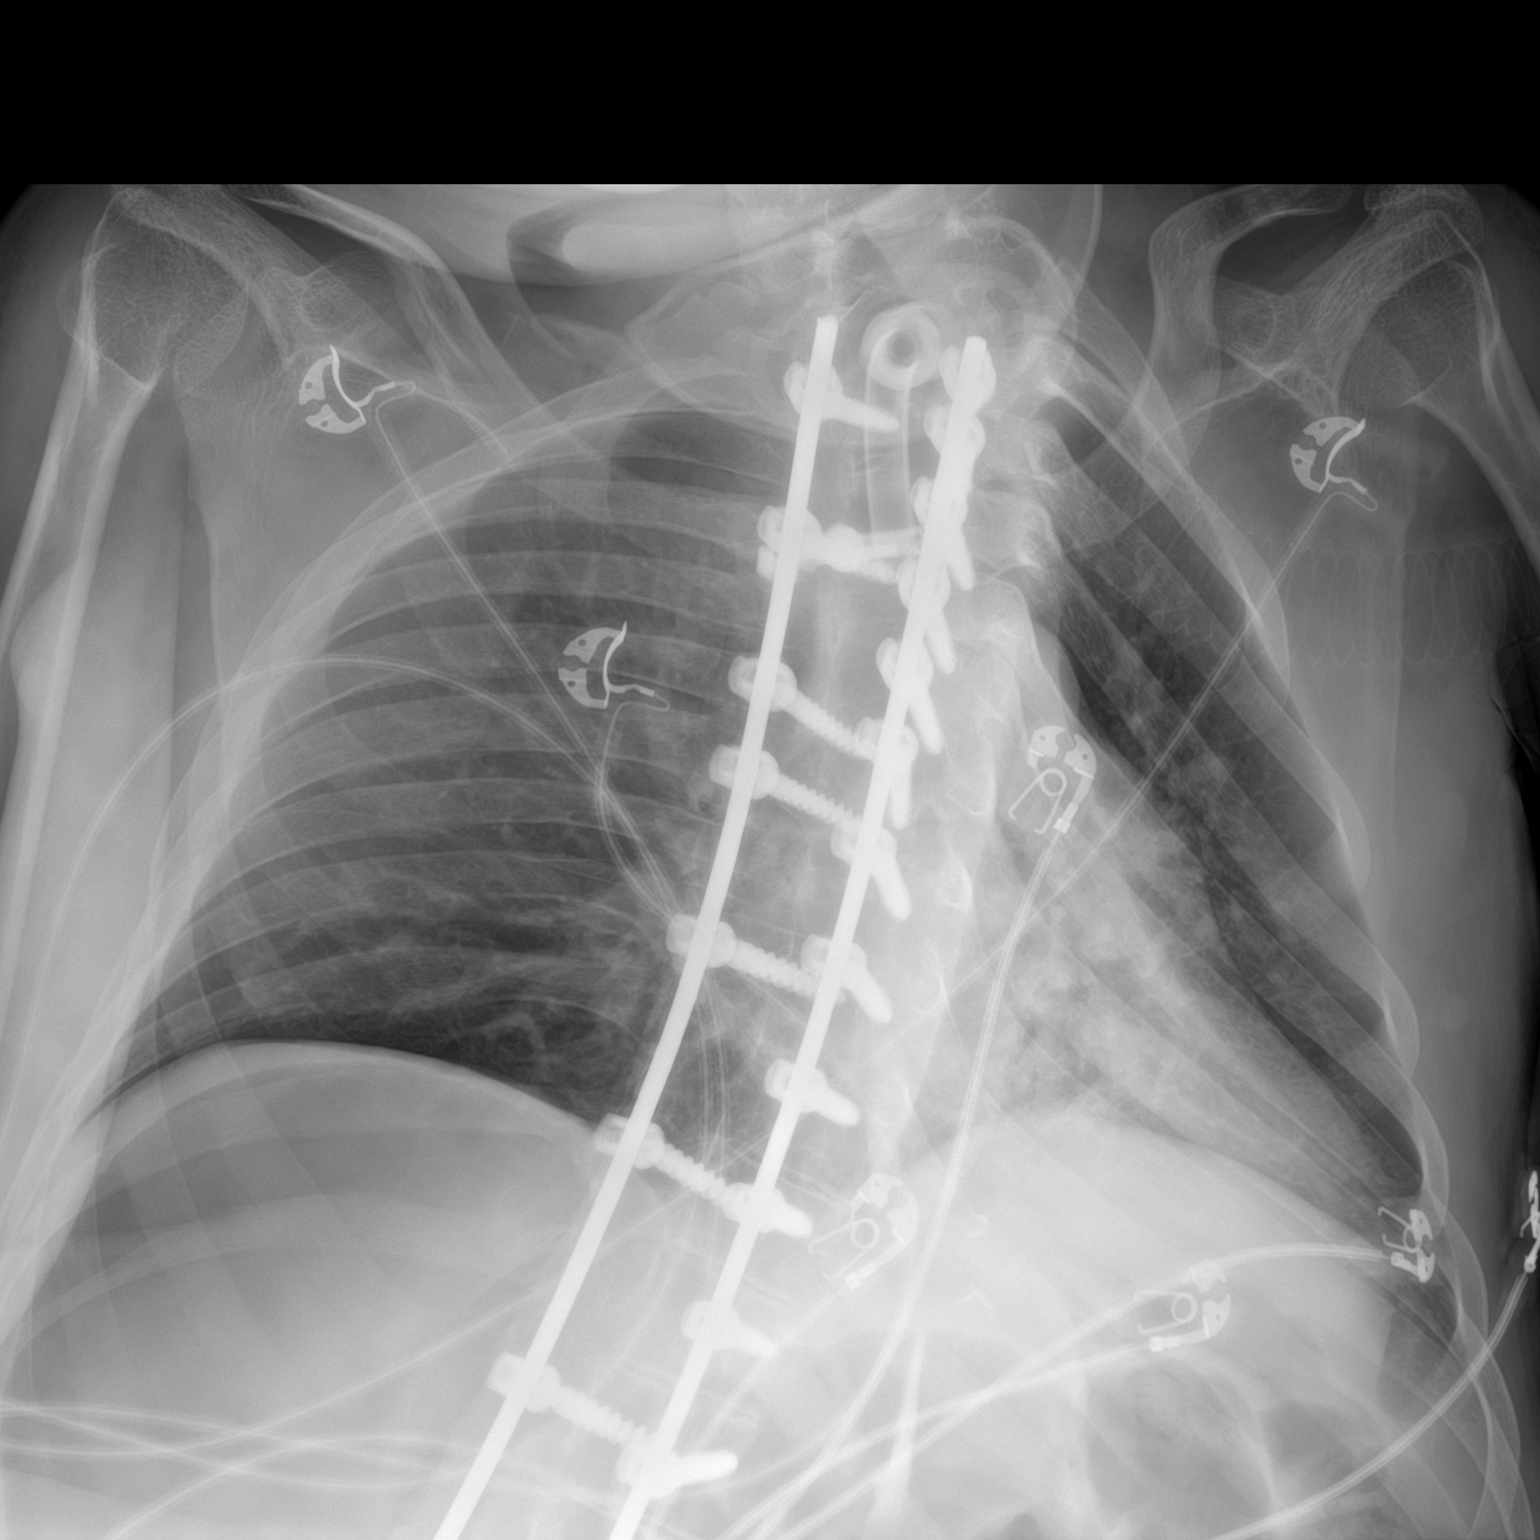

[1 of 1 positions shown; findings below may reference images not displayed]

FINDINGS: Tracheostomy tube noted with tip over the trachea. Heart size
normal. Bibasilar atelectasis. Left base infiltrate suggesting
pneumonia noted. No pleural effusion or pneumothorax. Thoracic spine
scoliosis with thoracolumbar spine fusion. Hardware intact.
IMPRESSION: 1.  Tracheostomy tube in good anatomic position.

2. Bibasilar atelectasis. Left base infiltrate consistent pneumonia.
